# Patient Record
Sex: Female | Born: 1965
Health system: Southern US, Community
[De-identification: ages and names within clinical notes are randomized; demographics above are authoritative.]

## PROBLEM LIST (undated history)

## (undated) DIAGNOSIS — E559 Vitamin D deficiency, unspecified: Secondary | ICD-10-CM

## (undated) DIAGNOSIS — K219 Gastro-esophageal reflux disease without esophagitis: Secondary | ICD-10-CM

## (undated) DIAGNOSIS — E079 Disorder of thyroid, unspecified: Secondary | ICD-10-CM

## (undated) HISTORY — DX: Disorder of thyroid, unspecified: E07.9

## (undated) HISTORY — DX: Gastro-esophageal reflux disease without esophagitis: K21.9

## (undated) HISTORY — DX: Vitamin D deficiency, unspecified: E55.9

---

## 1999-05-06 ENCOUNTER — Other Ambulatory Visit: Admission: RE | Admit: 1999-05-06 | Discharge: 1999-05-06 | Payer: Self-pay | Admitting: *Deleted

## 1999-06-24 ENCOUNTER — Other Ambulatory Visit: Admission: RE | Admit: 1999-06-24 | Discharge: 1999-06-24 | Payer: Self-pay | Admitting: *Deleted

## 1999-06-24 ENCOUNTER — Encounter (INDEPENDENT_AMBULATORY_CARE_PROVIDER_SITE_OTHER): Payer: Self-pay

## 2003-09-05 ENCOUNTER — Other Ambulatory Visit: Admission: RE | Admit: 2003-09-05 | Discharge: 2003-09-05 | Payer: Self-pay | Admitting: Family Medicine

## 2006-06-12 ENCOUNTER — Other Ambulatory Visit: Admission: RE | Admit: 2006-06-12 | Discharge: 2006-06-12 | Payer: Self-pay | Admitting: Family Medicine

## 2007-07-23 ENCOUNTER — Other Ambulatory Visit: Admission: RE | Admit: 2007-07-23 | Discharge: 2007-07-23 | Payer: Self-pay | Admitting: Family Medicine

## 2008-11-30 ENCOUNTER — Other Ambulatory Visit: Admission: RE | Admit: 2008-11-30 | Discharge: 2008-11-30 | Payer: Self-pay | Admitting: Family Medicine

## 2010-08-30 ENCOUNTER — Other Ambulatory Visit
Admission: RE | Admit: 2010-08-30 | Discharge: 2010-08-30 | Payer: Self-pay | Source: Home / Self Care | Admitting: Family Medicine

## 2011-08-27 ENCOUNTER — Other Ambulatory Visit: Payer: Self-pay | Admitting: Family Medicine

## 2011-08-27 ENCOUNTER — Other Ambulatory Visit (HOSPITAL_COMMUNITY)
Admission: RE | Admit: 2011-08-27 | Discharge: 2011-08-27 | Disposition: A | Discharge status: Home / Self Care | Payer: Self-pay | Source: Ambulatory Visit | Attending: Family Medicine | Admitting: Family Medicine

## 2011-08-27 DIAGNOSIS — Z124 Encounter for screening for malignant neoplasm of cervix: Secondary | ICD-10-CM | POA: Insufficient documentation

## 2012-10-11 ENCOUNTER — Other Ambulatory Visit: Payer: Self-pay | Admitting: Family Medicine

## 2012-10-15 ENCOUNTER — Other Ambulatory Visit: Payer: Self-pay | Admitting: Family Medicine

## 2012-10-15 DIAGNOSIS — N6320 Unspecified lump in the left breast, unspecified quadrant: Secondary | ICD-10-CM

## 2012-10-20 ENCOUNTER — Other Ambulatory Visit: Payer: Self-pay | Admitting: Radiology

## 2012-10-27 ENCOUNTER — Other Ambulatory Visit: Payer: Self-pay

## 2014-09-27 ENCOUNTER — Other Ambulatory Visit: Payer: Self-pay | Admitting: Family Medicine

## 2014-09-27 ENCOUNTER — Other Ambulatory Visit (HOSPITAL_COMMUNITY)
Admission: RE | Admit: 2014-09-27 | Discharge: 2014-09-27 | Disposition: A | Discharge status: Home / Self Care | Payer: BLUE CROSS/BLUE SHIELD | Source: Ambulatory Visit | Attending: Family Medicine | Admitting: Family Medicine

## 2014-09-27 DIAGNOSIS — Z124 Encounter for screening for malignant neoplasm of cervix: Secondary | ICD-10-CM | POA: Diagnosis present

## 2014-09-27 DIAGNOSIS — Z1151 Encounter for screening for human papillomavirus (HPV): Secondary | ICD-10-CM | POA: Diagnosis present

## 2014-10-02 LAB — CYTOLOGY - PAP

## 2016-09-16 DIAGNOSIS — Z0000 Encounter for general adult medical examination without abnormal findings: Secondary | ICD-10-CM | POA: Diagnosis not present

## 2016-09-16 DIAGNOSIS — E559 Vitamin D deficiency, unspecified: Secondary | ICD-10-CM | POA: Diagnosis not present

## 2016-09-16 DIAGNOSIS — Z1231 Encounter for screening mammogram for malignant neoplasm of breast: Secondary | ICD-10-CM | POA: Diagnosis not present

## 2016-09-16 DIAGNOSIS — Z79899 Other long term (current) drug therapy: Secondary | ICD-10-CM | POA: Diagnosis not present

## 2016-09-16 DIAGNOSIS — Z13220 Encounter for screening for lipoid disorders: Secondary | ICD-10-CM | POA: Diagnosis not present

## 2016-09-16 DIAGNOSIS — E039 Hypothyroidism, unspecified: Secondary | ICD-10-CM | POA: Diagnosis not present

## 2016-09-16 DIAGNOSIS — Z1159 Encounter for screening for other viral diseases: Secondary | ICD-10-CM | POA: Diagnosis not present

## 2016-12-18 DIAGNOSIS — E559 Vitamin D deficiency, unspecified: Secondary | ICD-10-CM | POA: Diagnosis not present

## 2017-10-08 ENCOUNTER — Other Ambulatory Visit: Payer: Self-pay | Admitting: Family Medicine

## 2017-10-08 ENCOUNTER — Other Ambulatory Visit (HOSPITAL_COMMUNITY)
Admission: RE | Admit: 2017-10-08 | Discharge: 2017-10-08 | Disposition: A | Discharge status: Home / Self Care | Payer: BLUE CROSS/BLUE SHIELD | Source: Ambulatory Visit | Attending: Family Medicine | Admitting: Family Medicine

## 2017-10-08 DIAGNOSIS — Z79899 Other long term (current) drug therapy: Secondary | ICD-10-CM | POA: Diagnosis not present

## 2017-10-08 DIAGNOSIS — Z1231 Encounter for screening mammogram for malignant neoplasm of breast: Secondary | ICD-10-CM | POA: Diagnosis not present

## 2017-10-08 DIAGNOSIS — Z124 Encounter for screening for malignant neoplasm of cervix: Secondary | ICD-10-CM | POA: Diagnosis not present

## 2017-10-08 DIAGNOSIS — Z01411 Encounter for gynecological examination (general) (routine) with abnormal findings: Secondary | ICD-10-CM | POA: Diagnosis not present

## 2017-10-08 DIAGNOSIS — Z803 Family history of malignant neoplasm of breast: Secondary | ICD-10-CM | POA: Diagnosis not present

## 2017-10-08 DIAGNOSIS — E039 Hypothyroidism, unspecified: Secondary | ICD-10-CM | POA: Diagnosis not present

## 2017-10-08 DIAGNOSIS — E559 Vitamin D deficiency, unspecified: Secondary | ICD-10-CM | POA: Diagnosis not present

## 2017-10-08 DIAGNOSIS — Z0000 Encounter for general adult medical examination without abnormal findings: Secondary | ICD-10-CM | POA: Diagnosis not present

## 2017-10-12 LAB — CYTOLOGY - PAP: Diagnosis: NEGATIVE

## 2018-01-21 DIAGNOSIS — M79605 Pain in left leg: Secondary | ICD-10-CM | POA: Diagnosis not present

## 2018-01-21 DIAGNOSIS — E039 Hypothyroidism, unspecified: Secondary | ICD-10-CM | POA: Diagnosis not present

## 2018-01-22 DIAGNOSIS — M79605 Pain in left leg: Secondary | ICD-10-CM | POA: Diagnosis not present

## 2018-01-28 ENCOUNTER — Other Ambulatory Visit: Payer: Self-pay

## 2018-01-28 DIAGNOSIS — R600 Localized edema: Secondary | ICD-10-CM

## 2018-01-28 DIAGNOSIS — M7989 Other specified soft tissue disorders: Secondary | ICD-10-CM

## 2018-03-15 ENCOUNTER — Ambulatory Visit (INDEPENDENT_AMBULATORY_CARE_PROVIDER_SITE_OTHER): Payer: BLUE CROSS/BLUE SHIELD | Admitting: Surgery

## 2018-03-15 ENCOUNTER — Ambulatory Visit (HOSPITAL_COMMUNITY)
Admission: RE | Admit: 2018-03-15 | Discharge: 2018-03-15 | Disposition: A | Discharge status: Home / Self Care | Payer: BLUE CROSS/BLUE SHIELD | Source: Ambulatory Visit | Attending: Surgery | Admitting: Surgery

## 2018-03-15 ENCOUNTER — Other Ambulatory Visit: Payer: Self-pay

## 2018-03-15 ENCOUNTER — Encounter: Payer: Self-pay | Admitting: Surgery

## 2018-03-15 VITALS — BP 135/87 | HR 71 | Resp 18 | Ht 64.0 in | Wt 165.0 lb

## 2018-03-15 DIAGNOSIS — M7989 Other specified soft tissue disorders: Secondary | ICD-10-CM

## 2018-03-15 DIAGNOSIS — R600 Localized edema: Secondary | ICD-10-CM

## 2018-03-15 NOTE — Progress Notes (Signed)
[facility]  Patient name: Kayla Ponce MRN: [id number] DOB: 1966-03-26 Sex: female   REQUESTING PROVIDER:    Dr. Paulino Rily   REASON FOR CONSULT:    Leg pain and swelling  HISTORY OF PRESENT ILLNESS:   Kayla Ponce is a 52 y.o. female, who is referred for evaluation of leg pain and swelling.  She states that her left leg has been bothering her for approximately 6-8 months, and her right leg began bothering her about 1 month ago.  She says her feet hurt on the bottom.  Sometimes when she is in the car driving to work when she gets out an hour and a half later that she has trouble walking because of the pain.  Once she gets walking, the pain seems to get better.  She also has swelling at night.  She comes with an ultrasound that was negative for DVT.  PAST MEDICAL HISTORY    Past Medical History:  Diagnosis Date  . GERD (gastroesophageal reflux disease)   . Thyroid disease   . Vitamin D deficiency      FAMILY HISTORY   History reviewed. No pertinent family history.  SOCIAL HISTORY:   Social History   Socioeconomic History  . Marital status: Married    Spouse name: Not on file  . Number of children: Not on file  . Years of education: Not on file  . Highest education level: Not on file  Occupational History  . Not on file  Social Needs  . Financial resource strain: Not on file  . Food insecurity:    Worry: Not on file    Inability: Not on file  . Transportation needs:    Medical: Not on file    Non-medical: Not on file  Tobacco Use  . Smoking status: Current Every Day Smoker    Packs/day: 0.00    Years: 35.00    Pack years: 0.00    Types: Cigarettes  . Smokeless tobacco: Never Used  Substance and Sexual Activity  . Alcohol use: Not on file  . Drug use: Not on file  . Sexual activity: Not on file  Lifestyle  . Physical activity:    Days per week: Not on file    Minutes per session: Not on  file  . Stress: Not on file  Relationships  . Social connections:    Talks on phone: Not on file    Gets together: Not on file    Attends religious service: Not on file    Active member of club or organization: Not on file    Attends meetings of clubs or organizations: Not on file    Relationship status: Not on file  . Intimate partner violence:    Fear of current or ex partner: Not on file    Emotionally abused: Not on file    Physically abused: Not on file    Forced sexual activity: Not on file  Other Topics Concern  . Not on file  Social History Narrative  . Not on file    ALLERGIES:    No Known Allergies  CURRENT MEDICATIONS:    Current Outpatient Medications  Medication Sig Dispense Refill  . levothyroxine (SYNTHROID, LEVOTHROID) 137 MCG tablet Take 137 mcg by mouth daily before breakfast.     No current facility-administered medications for this visit.     REVIEW OF SYSTEMS:   [X]  denotes positive finding, [ ]  denotes negative finding Cardiac  Comments:  Chest pain or  chest pressure:    Shortness of breath upon exertion:    Short of breath when lying flat:    Irregular heart rhythm:        Vascular    Pain in calf, thigh, or hip brought on by ambulation:    Pain in feet at night that wakes you up from your sleep:     Blood clot in your veins:    Leg swelling:  x       Pulmonary    Oxygen at home:    Productive cough:     Wheezing:         Neurologic    Sudden weakness in arms or legs:     Sudden numbness in arms or legs:     Sudden onset of difficulty speaking or slurred speech:    Temporary loss of vision in one eye:     Problems with dizziness:         Gastrointestinal    Blood in stool:      Vomited blood:         Genitourinary    Burning when urinating:     Blood in urine:        Psychiatric    Major depression:         Hematologic    Bleeding problems:    Problems with blood clotting too easily:        Skin    Rashes or  ulcers:        Constitutional    Fever or chills:     PHYSICAL EXAM:   Vitals:   03/15/18 1000  BP: 135/87  Pulse: 71  Resp: 18  SpO2: 99%  Weight: 165 lb (74.8 kg)  Height: 5\' 4"  (1.626 m)    GENERAL: The patient is a well-nourished female, in no acute distress. The vital signs are documented above. CARDIAC: There is a regular rate and rhythm.  VASCULAR: Palpable bilateral posterior tibial pulse.  1+ pitting edema up to the midcalf bilaterally PULMONARY: Nonlabored respirations MUSCULOSKELETAL: There are no major deformities or cyanosis. NEUROLOGIC: No focal weakness or paresthesias are detected. SKIN: There are no ulcers or rashes noted. PSYCHIATRIC: The patient has a normal affect.  STUDIES:   I have ordered and reviewed her vascular lab studies with the following findings: Venous reflux study: There is no evidence of DVT, superficial venous thrombosis or chronic venous insufficiency in either lower extremity  ASSESSMENT and PLAN   Bilateral leg pain: I discussed with the patient that she has palpable pulses and a normal venous reflux evaluation.  I do not think her symptoms are related to arterial or venous pathology.  She likely has a component of lymphedema contributing to her swelling.  For this I have recommended wearing 20-30 compression stockings to see if this helps alleviate some of her symptoms.  I also told her that since her symptoms appear to be getting better with walking that she may have some orthopedic foot problem and may benefit from seeing an orthopedist.  She is going to try the compression stockings and then speak with her medical doctor about the next steps.   Durene CalWells Brabham, MD [facility] Tel [phone] Pager [phone]

## 2018-06-16 DIAGNOSIS — N63 Unspecified lump in unspecified breast: Secondary | ICD-10-CM | POA: Diagnosis not present

## 2018-06-24 DIAGNOSIS — N6001 Solitary cyst of right breast: Secondary | ICD-10-CM | POA: Diagnosis not present

## 2018-08-16 DIAGNOSIS — M545 Low back pain: Secondary | ICD-10-CM | POA: Diagnosis not present

## 2018-08-16 DIAGNOSIS — R05 Cough: Secondary | ICD-10-CM | POA: Diagnosis not present

## 2018-08-16 DIAGNOSIS — R509 Fever, unspecified: Secondary | ICD-10-CM | POA: Diagnosis not present

## 2018-09-24 DIAGNOSIS — J111 Influenza due to unidentified influenza virus with other respiratory manifestations: Secondary | ICD-10-CM | POA: Diagnosis not present

## 2018-10-04 ENCOUNTER — Encounter (HOSPITAL_COMMUNITY): Payer: Self-pay | Admitting: *Deleted

## 2018-10-04 ENCOUNTER — Observation Stay (HOSPITAL_COMMUNITY)
Admission: EM | Admit: 2018-10-04 | Discharge: 2018-10-05 | Disposition: A | Discharge status: Home / Self Care | Payer: BLUE CROSS/BLUE SHIELD | Attending: Internal Medicine | Admitting: Internal Medicine

## 2018-10-04 ENCOUNTER — Emergency Department (HOSPITAL_COMMUNITY): Payer: BLUE CROSS/BLUE SHIELD

## 2018-10-04 DIAGNOSIS — E559 Vitamin D deficiency, unspecified: Secondary | ICD-10-CM | POA: Insufficient documentation

## 2018-10-04 DIAGNOSIS — R9431 Abnormal electrocardiogram [ECG] [EKG]: Secondary | ICD-10-CM | POA: Diagnosis not present

## 2018-10-04 DIAGNOSIS — F17210 Nicotine dependence, cigarettes, uncomplicated: Secondary | ICD-10-CM | POA: Insufficient documentation

## 2018-10-04 DIAGNOSIS — J45901 Unspecified asthma with (acute) exacerbation: Secondary | ICD-10-CM | POA: Diagnosis not present

## 2018-10-04 DIAGNOSIS — E876 Hypokalemia: Secondary | ICD-10-CM | POA: Insufficient documentation

## 2018-10-04 DIAGNOSIS — J9601 Acute respiratory failure with hypoxia: Principal | ICD-10-CM | POA: Insufficient documentation

## 2018-10-04 DIAGNOSIS — J189 Pneumonia, unspecified organism: Secondary | ICD-10-CM | POA: Insufficient documentation

## 2018-10-04 DIAGNOSIS — J209 Acute bronchitis, unspecified: Secondary | ICD-10-CM | POA: Insufficient documentation

## 2018-10-04 DIAGNOSIS — Z79890 Hormone replacement therapy (postmenopausal): Secondary | ICD-10-CM | POA: Diagnosis not present

## 2018-10-04 DIAGNOSIS — N6310 Unspecified lump in the right breast, unspecified quadrant: Secondary | ICD-10-CM | POA: Insufficient documentation

## 2018-10-04 DIAGNOSIS — E039 Hypothyroidism, unspecified: Secondary | ICD-10-CM | POA: Diagnosis not present

## 2018-10-04 DIAGNOSIS — M79604 Pain in right leg: Secondary | ICD-10-CM | POA: Insufficient documentation

## 2018-10-04 DIAGNOSIS — R0600 Dyspnea, unspecified: Secondary | ICD-10-CM | POA: Diagnosis not present

## 2018-10-04 DIAGNOSIS — K219 Gastro-esophageal reflux disease without esophagitis: Secondary | ICD-10-CM | POA: Diagnosis not present

## 2018-10-04 DIAGNOSIS — N6001 Solitary cyst of right breast: Secondary | ICD-10-CM

## 2018-10-04 DIAGNOSIS — R0602 Shortness of breath: Secondary | ICD-10-CM | POA: Diagnosis not present

## 2018-10-04 LAB — I-STAT BETA HCG BLOOD, ED (MC, WL, AP ONLY): I-stat hCG, quantitative: <5 m[IU]/mL (ref <5)

## 2018-10-04 LAB — CBC
HCT: 39.4 % (ref 36.0–46.0)
HEMOGLOBIN: 13 g/dL (ref 12.0–15.0)
MCH: 30.6 pg (ref 26.0–34.0)
MCHC: 33 g/dL (ref 30.0–36.0)
MCV: 92.7 fL (ref 80.0–100.0)
Platelets: 288 10*3/uL (ref 150–400)
RBC: 4.25 MIL/uL (ref 3.87–5.11)
RDW: 11.9 % (ref 11.5–15.5)
WBC: 7 10*3/uL (ref 4.0–10.5)
nRBC: 0 % (ref 0.0–0.2)

## 2018-10-04 LAB — BASIC METABOLIC PANEL
Anion gap: 10 (ref 5–15)
BUN: 7 mg/dL (ref 6–20)
CALCIUM: 9 mg/dL (ref 8.9–10.3)
CO2: 26 mmol/L (ref 22–32)
Chloride: 106 mmol/L (ref 98–111)
Creatinine, Ser: 1.01 mg/dL — ABNORMAL HIGH (ref 0.44–1.00)
GFR calc Af Amer: >60 mL/min (ref >60)
GLUCOSE: 115 mg/dL — AB (ref 70–99)
POTASSIUM: 2.7 mmol/L — AB (ref 3.5–5.1)
Sodium: 142 mmol/L (ref 135–145)

## 2018-10-04 LAB — I-STAT TROPONIN, ED: TROPONIN I, POC: 0.02 ng/mL (ref 0.00–0.08)

## 2018-10-04 LAB — MAGNESIUM: MAGNESIUM: 2.5 mg/dL — AB (ref 1.7–2.4)

## 2018-10-04 MED ORDER — POTASSIUM CHLORIDE CRYS ER 20 MEQ PO TBCR
40.0000 meq | EXTENDED_RELEASE_TABLET | Freq: Once | ORAL | Status: AC
  Administered 2018-10-04: 40 meq via ORAL
  Filled 2018-10-04: qty 2

## 2018-10-04 MED ORDER — ONDANSETRON HCL 4 MG PO TABS: 4.0000 mg | ORAL_TABLET | Freq: Four times a day (QID) | ORAL | Status: DC | PRN

## 2018-10-04 MED ORDER — ACETAMINOPHEN 650 MG RE SUPP: 650.0000 mg | Freq: Four times a day (QID) | RECTAL | Status: DC | PRN

## 2018-10-04 MED ORDER — IOPAMIDOL (ISOVUE-370) INJECTION 76%
INTRAVENOUS | Status: AC
  Administered 2018-10-04: 20:00:00
  Filled 2018-10-04: qty 100

## 2018-10-04 MED ORDER — ONDANSETRON HCL 4 MG/2ML IJ SOLN: 4.0000 mg | Freq: Four times a day (QID) | INTRAMUSCULAR | Status: DC | PRN

## 2018-10-04 MED ORDER — LEVOTHYROXINE SODIUM 25 MCG PO TABS
137.0000 ug | ORAL_TABLET | Freq: Every day | ORAL | Status: DC
  Administered 2018-10-05: 137 ug via ORAL
  Filled 2018-10-04: qty 1

## 2018-10-04 MED ORDER — SODIUM CHLORIDE 0.9 % IV SOLN
500.0000 mg | Freq: Once | INTRAVENOUS | Status: AC
  Administered 2018-10-04: 500 mg via INTRAVENOUS
  Filled 2018-10-04: qty 500

## 2018-10-04 MED ORDER — IPRATROPIUM BROMIDE 0.02 % IN SOLN
0.5000 mg | Freq: Once | RESPIRATORY_TRACT | Status: AC
  Administered 2018-10-04: 0.5 mg via RESPIRATORY_TRACT
  Filled 2018-10-04: qty 2.5

## 2018-10-04 MED ORDER — ALBUTEROL SULFATE (2.5 MG/3ML) 0.083% IN NEBU
5.0000 mg | INHALATION_SOLUTION | Freq: Once | RESPIRATORY_TRACT | Status: AC
  Administered 2018-10-04: 5 mg via RESPIRATORY_TRACT
  Filled 2018-10-04: qty 6

## 2018-10-04 MED ORDER — ALBUTEROL SULFATE (2.5 MG/3ML) 0.083% IN NEBU
2.5000 mg | INHALATION_SOLUTION | RESPIRATORY_TRACT | Status: DC
  Administered 2018-10-05 (×4): 2.5 mg via RESPIRATORY_TRACT
  Filled 2018-10-04 (×4): qty 3

## 2018-10-04 MED ORDER — POTASSIUM CHLORIDE 10 MEQ/100ML IV SOLN
10.0000 meq | INTRAVENOUS | Status: AC
  Administered 2018-10-04: 10 meq via INTRAVENOUS
  Filled 2018-10-04: qty 100

## 2018-10-04 MED ORDER — ENOXAPARIN SODIUM 40 MG/0.4ML ~~LOC~~ SOLN
40.0000 mg | SUBCUTANEOUS | Status: DC
  Administered 2018-10-05: 40 mg via SUBCUTANEOUS
  Filled 2018-10-04 (×2): qty 0.4

## 2018-10-04 MED ORDER — ALBUTEROL SULFATE (2.5 MG/3ML) 0.083% IN NEBU: 2.5000 mg | INHALATION_SOLUTION | RESPIRATORY_TRACT | Status: DC | PRN

## 2018-10-04 MED ORDER — SODIUM CHLORIDE 0.9 % IV SOLN: 1.0000 g | INTRAVENOUS | Status: DC

## 2018-10-04 MED ORDER — SODIUM CHLORIDE 0.9% FLUSH
3.0000 mL | Freq: Once | INTRAVENOUS | Status: AC
  Administered 2018-10-04: 3 mL via INTRAVENOUS

## 2018-10-04 MED ORDER — METHYLPREDNISOLONE SODIUM SUCC 125 MG IJ SOLR
125.0000 mg | Freq: Once | INTRAMUSCULAR | Status: AC
  Administered 2018-10-04: 125 mg via INTRAVENOUS
  Filled 2018-10-04: qty 2

## 2018-10-04 MED ORDER — SODIUM CHLORIDE 0.9 % IV SOLN
500.0000 mg | INTRAVENOUS | Status: DC
  Filled 2018-10-04 (×2): qty 500

## 2018-10-04 MED ORDER — METHYLPREDNISOLONE SODIUM SUCC 40 MG IJ SOLR
40.0000 mg | Freq: Two times a day (BID) | INTRAMUSCULAR | Status: DC
  Administered 2018-10-05: 40 mg via INTRAVENOUS
  Filled 2018-10-04: qty 1

## 2018-10-04 MED ORDER — DEXTROSE 5 % IV SOLN
250.0000 mg | Freq: Once | INTRAVENOUS | Status: DC
  Filled 2018-10-04: qty 250

## 2018-10-04 MED ORDER — ONDANSETRON 4 MG PO TBDP: 4.0000 mg | ORAL_TABLET | Freq: Once | ORAL | Status: DC

## 2018-10-04 MED ORDER — SODIUM CHLORIDE 0.9 % IV SOLN
1.0000 g | Freq: Once | INTRAVENOUS | Status: AC
  Administered 2018-10-04: 1 g via INTRAVENOUS
  Filled 2018-10-04: qty 10

## 2018-10-04 MED ORDER — ACETAMINOPHEN 325 MG PO TABS: 650.0000 mg | ORAL_TABLET | Freq: Four times a day (QID) | ORAL | Status: DC | PRN

## 2018-10-04 NOTE — ED Provider Notes (Signed)
[facility] EMERGENCY DEPARTMENT Provider Note   CSN: [id number] Arrival date & time: 10/04/18  1525     History   Chief Complaint Chief Complaint  Patient presents with  . Shortness of Breath    HPI Kayla Ponce is a 53 y.o. female.  HPI  Patient is a 53 year old female with a history of hypothyroidism, vitamin D deficiency, GERD presenting for shortness of breath.  Patient reports that her symptoms began worsening over the past 3 to 4 days.  She reports that she was treated for influenza on Friday, January 30 because her husband had a positive test for influenza A and she had the same symptoms.  She reports that she had fevers at that time, myalgias, headache, sore throat, and congestion rhinorrhea.  This seem to be resolving, however patient reports that she is short of breath with any exertion.  She reports that she is wheezing.  She reports that she was nauseous over the course of this illness but has not vomited.  She reports 2-3 episodes of diarrhea but has not had any in the past 3 to 4 days.  Patient ports she smokes approximately 1 pack of cigarettes a day.  She denies any history of chronic lung disease.  Patient denies any recent mobilization, hospitalization, hormone use, cancer treatment, history DVT/PE, recent surgery, lower extremity edema or calf tenderness.  No hemoptysis.  Past Medical History:  Diagnosis Date  . GERD (gastroesophageal reflux disease)   . Thyroid disease   . Vitamin D deficiency     There are no active problems to display for this patient.   History reviewed. No pertinent surgical history.   OB History   No obstetric history on file.      Home Medications    Prior to Admission medications   Medication Sig Start Date End Date Taking? Authorizing Provider  levothyroxine (SYNTHROID, LEVOTHROID) 137 MCG tablet Take 137 mcg by mouth daily before breakfast.    [provider]    Family History No family history  on file.  Social History Social History   Tobacco Use  . Smoking status: Current Every Day Smoker    Packs/day: 0.75    Years: 35.00    Pack years: 26.25    Types: Cigarettes  . Smokeless tobacco: Never Used  Substance Use Topics  . Alcohol use: Not Currently  . Drug use: Not Currently     Allergies   Patient has no known allergies.   Review of Systems Review of Systems  Constitutional: Negative for chills and fever.  HENT: Negative for congestion and sore throat.   Eyes: Negative for visual disturbance.  Respiratory: Positive for cough, shortness of breath and wheezing. Negative for chest tightness.   Cardiovascular: Negative for chest pain, palpitations and leg swelling.  Gastrointestinal: Negative for abdominal pain, nausea and vomiting.  Genitourinary: Negative for dysuria and flank pain.  Musculoskeletal: Positive for myalgias. Negative for back pain.  Skin: Negative for rash.  Neurological: Negative for dizziness, syncope, light-headedness and headaches.     Physical Exam Updated Vital Signs BP (!) 138/94   Pulse 65   Temp 98.4 F (36.9 C) (Oral)   Resp (!) 21   Ht 5\' 4"  (1.626 m)   Wt 71.7 kg   SpO2 95%   BMI 27.12 kg/m   Physical Exam Vitals signs and nursing note reviewed.  Constitutional:      General: She is not in acute distress.    Appearance: She is  well-developed.  HENT:     Head: Normocephalic and atraumatic.  Eyes:     Conjunctiva/sclera: Conjunctivae normal.     Pupils: Pupils are equal, round, and reactive to light.  Neck:     Musculoskeletal: Normal range of motion and neck supple.  Cardiovascular:     Rate and Rhythm: Normal rate and regular rhythm.     Heart sounds: S1 normal and S2 normal. No murmur.     Comments: No lower extremity edema.  No calf tenderness. Pulmonary:     Effort: Pulmonary effort is normal.     Breath sounds: Examination of the right-middle field reveals wheezing. Examination of the left-middle field  reveals wheezing. Examination of the right-lower field reveals wheezing. Examination of the left-lower field reveals wheezing. Wheezing present. No rales.  Abdominal:     General: There is no distension.     Palpations: Abdomen is soft.     Tenderness: There is no abdominal tenderness. There is no guarding.  Musculoskeletal: Normal range of motion.        General: No deformity.  Lymphadenopathy:     Cervical: No cervical adenopathy.  Skin:    General: Skin is warm and dry.     Findings: No erythema or rash.  Neurological:     Mental Status: She is alert.     Comments: Cranial nerves grossly intact. Patient moves extremities symmetrically and with good coordination.  Psychiatric:        Behavior: Behavior normal.        Thought Content: Thought content normal.        Judgment: Judgment normal.      ED Treatments / Results  Labs (all labs ordered are listed, but only abnormal results are displayed) Labs Reviewed  BASIC METABOLIC PANEL - Abnormal; Notable for the following components:      Result Value   Potassium 2.7 (*)    Glucose, Bld 115 (*)    Creatinine, Ser 1.01 (*)    All other components within normal limits  MAGNESIUM - Abnormal; Notable for the following components:   Magnesium 2.5 (*)    All other components within normal limits  CBC  I-STAT TROPONIN, ED  I-STAT BETA HCG BLOOD, ED ([facility], [facility], [facility] ONLY)    EKG EKG Interpretation  Date/Time:  Monday October 04 2018 15:32:59 EST Ventricular Rate:  82 PR Interval:  162 QRS Duration: 90 QT Interval:  382 QTC Calculation: 446 R Axis:   89 Text Interpretation:  Normal sinus rhythm ST & T wave abnormality, consider inferolateral ischemia Abnormal ECG Confirmed by Virgina NorfolkAdam, Curatolo [phone]) on 10/04/2018 6:44:13 PM   Radiology Dg Chest 2 View  Result Date: 10/04/2018 CLINICAL DATA:  Shortness of breath. Completed antibiotics 2 days ago. EXAM: CHEST - 2 VIEW COMPARISON:  None. FINDINGS: Lungs are adequately inflated  with possible mild linear density over the right middle lobe versus vascular crowding. No effusion. Cardiomediastinal silhouette and remainder the exam is within normal. IMPRESSION: Subtle nonspecific linear density over the right middle lobe on the lateral film which may be due vascular crowding, atelectasis or infection. Electronically Signed   By: Elberta Fortisaniel  Boyle M.D.   On: 10/04/2018 16:17   Ct Angio Chest Pe W/cm &/or Wo Cm  Result Date: 10/04/2018 CLINICAL DATA:  Shortness of Breath, history of upper respiratory infection EXAM: CT ANGIOGRAPHY CHEST WITH CONTRAST TECHNIQUE: Multidetector CT imaging of the chest was performed using the standard protocol during bolus administration of intravenous contrast. Multiplanar CT image reconstructions  and MIPs were obtained to evaluate the vascular anatomy. CONTRAST:  75 mL Isovue 370. COMPARISON:  Plain film from earlier in the same day. FINDINGS: Cardiovascular: Thoracic aorta shows no aneurysmal dilatation dissection. No cardiac enlargement is seen. The pulmonary artery shows a normal branching pattern. No filling defect to suggest pulmonary embolism is noted. Mild coronary calcifications are noted. Mediastinum/Nodes: The esophagus is within normal limits. No hilar or mediastinal adenopathy is noted. The thoracic inlet is within normal limits. Lungs/Pleura: Lungs are well aerated bilaterally. No sizable effusion is noted. Minimal patchy infiltrative changes noted in the right upper lobe consistent with the history of recent upper respiratory infection. No pneumothorax is seen. Minimal scarring in the right lung base is noted laterally. Upper Abdomen: Visualized upper abdomen is within normal limits. Musculoskeletal: No acute bony abnormality is noted. No compression deformity is seen. In the right breast there is a hypodense mass lesion which measures 4.4 cm in greatest dimension. This may represent a large breast cyst. Nonemergent workup is recommended. Review of  the MIP images confirms the above findings. IMPRESSION: No evidence of pulmonary emboli. Patchy infiltrative changes in the right upper lobe consistent with the history of resolving infection. Hypodense lesion within the right breast measuring 4.4 cm which may represent a large breast cyst. Nonemergent workup is recommended. Electronically Signed   By: Alcide Clever M.D.   On: 10/04/2018 20:03    Procedures Procedures (including critical care time)  Medications Ordered in ED Medications  sodium chloride flush (NS) 0.9 % injection 3 mL (has no administration in time range)  cefTRIAXone (ROCEPHIN) 1 g in sodium chloride 0.9 % 100 mL IVPB (has no administration in time range)  azithromycin (ZITHROMAX) 250 mg in dextrose 5 % 125 mL IVPB (has no administration in time range)  potassium chloride 10 mEq in 100 mL IVPB (has no administration in time range)  methylPREDNISolone sodium succinate (SOLU-MEDROL) 125 mg/2 mL injection 125 mg (125 mg Intravenous Given 10/04/18 1908)  ipratropium (ATROVENT) nebulizer solution 0.5 mg (0.5 mg Nebulization Given 10/04/18 1905)  albuterol (PROVENTIL) (2.5 MG/3ML) 0.083% nebulizer solution 5 mg (5 mg Nebulization Given 10/04/18 1905)  potassium chloride SA (K-DUR,KLOR-CON) CR tablet 40 mEq (40 mEq Oral Given 10/04/18 1905)  iopamidol (ISOVUE-370) 76 % injection (  Contrast Given 10/04/18 1930)     Initial Impression / Assessment and Plan / ED Course  I have reviewed the triage vital signs and the nursing notes.  Pertinent labs & imaging results that were available during my care of the patient were reviewed by me and considered in my medical decision making (see chart for details).     Patient nontoxic-appearing, afebrile, slightly tachypneic.  Patient with diffuse wheezes on exam.  No history of COPD or asthma.  No risk factors for pulmonary embolism, patient does have patchy infiltrate on the chest x-ray.  Question pneumonia versus other cause of infiltrate.  No  leukocytosis.  Do not suspect sepsis.  Patient has potassium of 2.7.  Nonspecific ST-T changes on EKG. Unclear etiology.  Patient is not on any diuretic therapy.  She has not had any persistent vomiting.  Repleted orally and IV.  CTPA demonstrates right middle lobe infiltrate suggestive of resolving infection.  No pulmonary embolism.  Patient also has a right breast cyst.  Patient reports she is currently getting work-up for this will have a mammogram in 1 week.  Patient given 1 breathing treatment, Solu-Medrol, and antibiotics for infiltrate.  Patient still has wheezing after initial  breathing treatment, however it is improved and lungs are less tight.  Will admit for respiratory decompensation the recent upper respiratory infection and potassium repletion.  Patient admitted to [facility] hospitalist per Dr. Toniann FailKakrakandy.  Appreciate his involvement in the care of this patient.  Final Clinical Impressions(s) / ED Diagnoses   Final diagnoses:  Reactive airway disease with acute exacerbation, unspecified asthma severity, unspecified whether persistent  Hypokalemia  Cyst of right breast    ED Discharge Orders    None       Delia ChimesMurray, Yakov Bergen B, PA-C 10/04/18 2105    Virgina NorfolkCuratolo, Adam, DO 10/04/18 2311

## 2018-10-04 NOTE — H&P (Signed)
History and Physical    Mindel Verhelst QMV:[id number] DOB: [birth year] DOA: 10/04/2018  PCP: Mila Palmer, MD  Patient coming from: Home.  Chief Complaint: Shortness of breath.  HPI: Kayla Ponce is a 53 y.o. female with history of tobacco abuse and hypothyroidism presents to the ER with complaint of persistent shortness of breath which has been ongoing for last 10 days.  About 10 days ago patient was empirically placed on Tamiflu by patient's primary care physician for wheezing and coughing since patient has been was diagnosed with influenza.  Patient completed the course of Tamiflu despite which patient was still having shortness of breath and cough.  Denies any chest pain.  Due to persistent wheezing and shortness of breath patient came to the ER.  ED Course: On exam patient had bilateral wheezing.  CT angiogram of the chest shows right upper lobe infiltrates concerning for resolving infection.  Patient was placed on IV steroids nebulizer treatment for bronchitis and empiric antibiotics for pneumonia.  Patient's potassium was 2.7 for which patient is getting replacement.  EKG shows nonspecific ST-T changes.  Review of Systems: As per HPI, rest all negative.   Past Medical History:  Diagnosis Date  . GERD (gastroesophageal reflux disease)   . Thyroid disease   . Vitamin D deficiency     History reviewed. No pertinent surgical history.   reports that she has been smoking cigarettes. She has a 26.25 pack-year smoking history. She has never used smokeless tobacco. She reports previous alcohol use. She reports previous drug use.  No Known Allergies  Family history - Mother has Diabetes Mellitus type 2  Prior to Admission medications   Medication Sig Start Date End Date Taking? Authorizing Provider  levothyroxine (SYNTHROID, LEVOTHROID) 137 MCG tablet Take 137 mcg by mouth daily before breakfast.   Yes [provider]    Physical Exam: Vitals:   10/04/18 2015 10/04/18  2030 10/04/18 2045 10/04/18 2130  BP:      Pulse: 82 78 73 87  Resp: 20 16 15 18   Temp:      TempSrc:      SpO2: 92% 93% 100% 95%  Weight:      Height:          Constitutional: Moderately built and nourished. Vitals:   10/04/18 2015 10/04/18 2030 10/04/18 2045 10/04/18 2130  BP:      Pulse: 82 78 73 87  Resp: 20 16 15 18   Temp:      TempSrc:      SpO2: 92% 93% 100% 95%  Weight:      Height:       Eyes: Anicteric no pallor. ENMT: No discharge from the ears eyes nose and mouth. Neck: No mass felt.  No neck rigidity. Respiratory: Bilateral expiratory wheeze and no crepitations. Cardiovascular: S1-S2 heard. Abdomen: Soft nontender bowel sounds present. Musculoskeletal: No edema.  No joint effusion. Skin: No rash. Neurologic: Alert awake oriented to time place and person.  Moves all extremities. Psychiatric: Appears normal.  Normal affect.   Labs on Admission: I have personally reviewed following labs and imaging studies  CBC: Recent Labs  Lab 10/04/18 1545  WBC 7.0  HGB 13.0  HCT 39.4  MCV 92.7  PLT 288   Basic Metabolic Panel: Recent Labs  Lab 10/04/18 1545 10/04/18 1842  NA 142  --   K 2.7*  --   CL 106  --   CO2 26  --   GLUCOSE 115*  --   BUN  7  --   CREATININE 1.01*  --   CALCIUM 9.0  --   MG  --  2.5*   GFR: Estimated Creatinine Clearance: 61.8 mL/min (A) (by C-G formula based on SCr of 1.01 mg/dL (H)). Liver Function Tests: No results for input(s): AST, ALT, ALKPHOS, BILITOT, PROT, ALBUMIN in the last 168 hours. No results for input(s): LIPASE, AMYLASE in the last 168 hours. No results for input(s): AMMONIA in the last 168 hours. Coagulation Profile: No results for input(s): INR, PROTIME in the last 168 hours. Cardiac Enzymes: No results for input(s): CKTOTAL, CKMB, CKMBINDEX, TROPONINI in the last 168 hours. BNP (last 3 results) No results for input(s): PROBNP in the last 8760 hours. HbA1C: No results for input(s): HGBA1C in the last 72  hours. CBG: No results for input(s): GLUCAP in the last 168 hours. Lipid Profile: No results for input(s): CHOL, HDL, LDLCALC, TRIG, CHOLHDL, LDLDIRECT in the last 72 hours. Thyroid Function Tests: No results for input(s): TSH, T4TOTAL, FREET4, T3FREE, THYROIDAB in the last 72 hours. Anemia Panel: No results for input(s): VITAMINB12, FOLATE, FERRITIN, TIBC, IRON, RETICCTPCT in the last 72 hours. Urine analysis: No results found for: COLORURINE, APPEARANCEUR, LABSPEC, PHURINE, GLUCOSEU, HGBUR, BILIRUBINUR, KETONESUR, PROTEINUR, UROBILINOGEN, NITRITE, LEUKOCYTESUR Sepsis Labs: @LABRCNTIP (procalcitonin:4,lacticidven:4) )No results found for this or any previous visit (from the past 240 hour(s)).   Radiological Exams on Admission: Dg Chest 2 View  Result Date: 10/04/2018 CLINICAL DATA:  Shortness of breath. Completed antibiotics 2 days ago. EXAM: CHEST - 2 VIEW COMPARISON:  None. FINDINGS: Lungs are adequately inflated with possible mild linear density over the right middle lobe versus vascular crowding. No effusion. Cardiomediastinal silhouette and remainder the exam is within normal. IMPRESSION: Subtle nonspecific linear density over the right middle lobe on the lateral film which may be due vascular crowding, atelectasis or infection. Electronically Signed   By: Elberta Fortisaniel  Boyle M.D.   On: 10/04/2018 16:17   Ct Angio Chest Pe W/cm &/or Wo Cm  Result Date: 10/04/2018 CLINICAL DATA:  Shortness of Breath, history of upper respiratory infection EXAM: CT ANGIOGRAPHY CHEST WITH CONTRAST TECHNIQUE: Multidetector CT imaging of the chest was performed using the standard protocol during bolus administration of intravenous contrast. Multiplanar CT image reconstructions and MIPs were obtained to evaluate the vascular anatomy. CONTRAST:  75 mL Isovue 370. COMPARISON:  Plain film from earlier in the same day. FINDINGS: Cardiovascular: Thoracic aorta shows no aneurysmal dilatation dissection. No cardiac  enlargement is seen. The pulmonary artery shows a normal branching pattern. No filling defect to suggest pulmonary embolism is noted. Mild coronary calcifications are noted. Mediastinum/Nodes: The esophagus is within normal limits. No hilar or mediastinal adenopathy is noted. The thoracic inlet is within normal limits. Lungs/Pleura: Lungs are well aerated bilaterally. No sizable effusion is noted. Minimal patchy infiltrative changes noted in the right upper lobe consistent with the history of recent upper respiratory infection. No pneumothorax is seen. Minimal scarring in the right lung base is noted laterally. Upper Abdomen: Visualized upper abdomen is within normal limits. Musculoskeletal: No acute bony abnormality is noted. No compression deformity is seen. In the right breast there is a hypodense mass lesion which measures 4.4 cm in greatest dimension. This may represent a large breast cyst. Nonemergent workup is recommended. Review of the MIP images confirms the above findings. IMPRESSION: No evidence of pulmonary emboli. Patchy infiltrative changes in the right upper lobe consistent with the history of resolving infection. Hypodense lesion within the right breast measuring 4.4 cm which  may represent a large breast cyst. Nonemergent workup is recommended. Electronically Signed   By: Alcide CleverMark  Lukens M.D.   On: 10/04/2018 20:03    EKG: Independently reviewed.  Normal sinus rhythm with nonspecific changes.  Assessment/Plan Principal Problem:   Acute respiratory failure with hypoxia (HCC) Active Problems:   CAP (community acquired pneumonia)   Hypokalemia   Acute bronchitis   Hypothyroidism    1. Acute respiratory failure with hypoxia secondary to bronchitis and pneumonia for which patient is placed on empiric antibiotics for community-acquired pneumonia check respiratory viral panel urine for Legionella strep antigen sputum cultures and continue with IV steroids nebulizer treatment. 2. Hypokalemia  likely from poor oral intake as patient states she has not been eating well last few days.  Replace recheck.  Check magnesium. 3. Abnormal density in the right breast seen in the CAT scan will need further work-up as outpatient. 4. Hypothyroidism on Synthroid. 5. Tobacco abuse advised patient to quit smoking.   DVT prophylaxis: Lovenox. Code Status: Full code. Family Communication: Discussed with patient. Disposition Plan: Home. Consults called: None. Admission status: Observation.   Eduard ClosArshad N Trae Bovenzi MD [facility] Pager [phone].  If 7PM-7AM, please contact night-coverage [email or web address] Password [email or web address]  10/04/2018, 9:49 PM

## 2018-10-04 NOTE — ED Triage Notes (Signed)
Pt in c/o SOB onset since diagnosis with upper respiratory infection with completed antibiotics x 2 days ago, pt denies CP, denies current n/v/d, A&O x4

## 2018-10-04 NOTE — ED Notes (Signed)
Critical potassium 2.6

## 2018-10-04 NOTE — ED Notes (Signed)
2.7 Potassium reported to Madilyn Hook, MD

## 2018-10-05 ENCOUNTER — Encounter (HOSPITAL_COMMUNITY): Payer: Self-pay

## 2018-10-05 ENCOUNTER — Other Ambulatory Visit: Payer: Self-pay

## 2018-10-05 DIAGNOSIS — J9601 Acute respiratory failure with hypoxia: Secondary | ICD-10-CM | POA: Diagnosis not present

## 2018-10-05 LAB — RESPIRATORY PANEL BY PCR

## 2018-10-05 LAB — TROPONIN I: Troponin I: <0.03 ng/mL (ref <0.03)

## 2018-10-05 LAB — BASIC METABOLIC PANEL
Anion gap: 12 (ref 5–15)
BUN: 7 mg/dL (ref 6–20)
CO2: 19 mmol/L — ABNORMAL LOW (ref 22–32)
CREATININE: 1.06 mg/dL — AB (ref 0.44–1.00)
Calcium: 8.7 mg/dL — ABNORMAL LOW (ref 8.9–10.3)
Chloride: 111 mmol/L (ref 98–111)
GFR calc Af Amer: >60 mL/min (ref >60)
GFR calc non Af Amer: 59 mL/min — ABNORMAL LOW (ref >60)
Glucose, Bld: 198 mg/dL — ABNORMAL HIGH (ref 70–99)
Potassium: 3.6 mmol/L (ref 3.5–5.1)
Sodium: 142 mmol/L (ref 135–145)

## 2018-10-05 LAB — CBC
HCT: 33.6 % — ABNORMAL LOW (ref 36.0–46.0)
Hemoglobin: 11.4 g/dL — ABNORMAL LOW (ref 12.0–15.0)
MCH: 31.5 pg (ref 26.0–34.0)
MCHC: 33.9 g/dL (ref 30.0–36.0)
MCV: 92.8 fL (ref 80.0–100.0)
Platelets: 268 10*3/uL (ref 150–400)
RBC: 3.62 MIL/uL — ABNORMAL LOW (ref 3.87–5.11)
RDW: 12.3 % (ref 11.5–15.5)
WBC: 8.8 10*3/uL (ref 4.0–10.5)
nRBC: 0 % (ref 0.0–0.2)

## 2018-10-05 LAB — EXPECTORATED SPUTUM ASSESSMENT W GRAM STAIN, RFLX TO RESP C

## 2018-10-05 LAB — VITAMIN B12: Vitamin B-12: 228 pg/mL (ref 180–914)

## 2018-10-05 LAB — HIV ANTIBODY (ROUTINE TESTING W REFLEX): HIV Screen 4th Generation wRfx: NONREACTIVE

## 2018-10-05 LAB — STREP PNEUMONIAE URINARY ANTIGEN: Strep Pneumo Urinary Antigen: NEGATIVE

## 2018-10-05 MED ORDER — CEFDINIR 300 MG PO CAPS: 300.0000 mg | ORAL_CAPSULE | Freq: Two times a day (BID) | ORAL | 0 refills | Status: AC

## 2018-10-05 MED ORDER — PREDNISONE 20 MG PO TABS: 20.0000 mg | ORAL_TABLET | Freq: Every day | ORAL | 0 refills | Status: DC

## 2018-10-05 MED ORDER — NICOTINE 14 MG/24HR TD PT24: 14.0000 mg | MEDICATED_PATCH | TRANSDERMAL | 0 refills | Status: AC

## 2018-10-05 MED ORDER — PREDNISONE 20 MG PO TABS
40.0000 mg | ORAL_TABLET | Freq: Every day | ORAL | Status: DC
  Administered 2018-10-05: 40 mg via ORAL
  Filled 2018-10-05: qty 2

## 2018-10-05 MED ORDER — POTASSIUM CHLORIDE 10 MEQ/100ML IV SOLN
10.0000 meq | Freq: Once | INTRAVENOUS | Status: AC
  Administered 2018-10-05: 10 meq via INTRAVENOUS
  Filled 2018-10-05: qty 100

## 2018-10-05 NOTE — Discharge Summary (Signed)
Physician Discharge Summary  Kayla Ponce [id number] DOB: Jul 05, 1966 DOA: 10/04/2018  PCP: Mila PalmerWolters, Sharon, MD  Admit date: 10/04/2018 Discharge date: 10/05/2018  Time spent: 45 minutes  Recommendations for Outpatient Follow-up:  1. PCP in 1 week, please follow-up B12 level, repeat vitamin D 2. - follow-up chest x-ray in 4 to 6 weeks 3. Needs a mammogram, known right breast cystic lesion   Discharge Diagnoses:  Principal Problem:   Acute respiratory failure with hypoxia (HCC) Active Problems:   CAP (community acquired pneumonia)   Hypokalemia   Acute bronchitis   Hypothyroidism   Discharge Condition: stable  Diet recommendation: heart healthy  Filed Weights   10/04/18 1541  Weight: 71.7 kg    History of present illness:  53 year old female with history of tobacco abuse and hypothyroidism presented to the ED with shortness of breath for 7 to 10 days recently completed a course of Tamiflu for influenza despite this continues to have shortness of breath cough and wheezing hence came to the ER underwent a CT which was notable for right upper lobe resolving infiltrates, potassium was 2.7  Hospital Course:   Acute hypoxic respiratory failure -Due to likely viral pneumonia, reactive airway disease from chronic smoking -Recently completed Tamiflu course for influenza cannot rule out secondary bacterial infection -Started on community-acquired pneumonia coverage and steroids transitioned to prednisone taper and oral cefdinir to complete a 5-day course, considerably improved today -Follow up x-ray in 4 to 6 weeks  Hypokalemia -Due to decreased oral intake and recent diarrhea from Tamiflu, replaced, corrected  Abnormal density in the right breast -Possible cystic lesion on CT, per patient this is being followed -She is scheduled for a mammogram on Monday  Tobacco abuse -Counseled  Hypothyroidism -Continue Synthroid  Some intermittent chronic right leg pain -Dopplers  ruled out DVT in the past -Check vitamin B12, repeat vitamin D level  Discharge Exam: Vitals:   10/05/18 0526 10/05/18 0845  BP:  121/61  Pulse:  80  Resp:  16  Temp:  98.1 F (36.7 C)  SpO2: 93% 98%    General: AAOx3 Cardiovascular:S1S2/RRR Respiratory: Few right basilar rhonchi  Discharge Instructions   Discharge Instructions    Diet - low sodium heart healthy   Complete by:  As directed    Increase activity slowly   Complete by:  As directed      Allergies as of 10/05/2018   No Known Allergies     Medication List    TAKE these medications   cefdinir 300 MG capsule Commonly known as:  OMNICEF Take 1 capsule (300 mg total) by mouth 2 (two) times daily for 4 days.   levothyroxine 137 MCG tablet Commonly known as:  SYNTHROID, LEVOTHROID Take 137 mcg by mouth daily before breakfast.   nicotine 14 mg/24hr patch Commonly known as:  NICODERM CQ - dosed in mg/24 hours Place 1 patch (14 mg total) onto the skin daily.   predniSONE 20 MG tablet Commonly known as:  DELTASONE Take 1-2 tablets (20-40 mg total) by mouth daily with breakfast. Take 40mg  daily for 2days then 20mg  daily for 2days then STOP      No Known Allergies Follow-up Information    Mila PalmerWolters, Sharon, MD Follow up in 1 week(s).   Specialty:  Family Medicine Why:  please FU VItamin B12 level, recheck Vitamin D level, Repeat CXR in 4-6weeks and Get Mammogram to Follow R Breast cyst Contact information: [address] [place] [address] [phone]  The results of significant diagnostics from this hospitalization (including imaging, microbiology, ancillary and laboratory) are listed below for reference.    Significant Diagnostic Studies: Dg Chest 2 View  Result Date: 10/04/2018 CLINICAL DATA:  Shortness of breath. Completed antibiotics 2 days ago. EXAM: CHEST - 2 VIEW COMPARISON:  None. FINDINGS: Lungs are adequately inflated with possible mild linear density  over the right middle lobe versus vascular crowding. No effusion. Cardiomediastinal silhouette and remainder the exam is within normal. IMPRESSION: Subtle nonspecific linear density over the right middle lobe on the lateral film which may be due vascular crowding, atelectasis or infection. Electronically Signed   By: Elberta Fortis M.D.   On: 10/04/2018 16:17   Ct Angio Chest Pe W/cm &/or Wo Cm  Result Date: 10/04/2018 CLINICAL DATA:  Shortness of Breath, history of upper respiratory infection EXAM: CT ANGIOGRAPHY CHEST WITH CONTRAST TECHNIQUE: Multidetector CT imaging of the chest was performed using the standard protocol during bolus administration of intravenous contrast. Multiplanar CT image reconstructions and MIPs were obtained to evaluate the vascular anatomy. CONTRAST:  75 mL Isovue 370. COMPARISON:  Plain film from earlier in the same day. FINDINGS: Cardiovascular: Thoracic aorta shows no aneurysmal dilatation dissection. No cardiac enlargement is seen. The pulmonary artery shows a normal branching pattern. No filling defect to suggest pulmonary embolism is noted. Mild coronary calcifications are noted. Mediastinum/Nodes: The esophagus is within normal limits. No hilar or mediastinal adenopathy is noted. The thoracic inlet is within normal limits. Lungs/Pleura: Lungs are well aerated bilaterally. No sizable effusion is noted. Minimal patchy infiltrative changes noted in the right upper lobe consistent with the history of recent upper respiratory infection. No pneumothorax is seen. Minimal scarring in the right lung base is noted laterally. Upper Abdomen: Visualized upper abdomen is within normal limits. Musculoskeletal: No acute bony abnormality is noted. No compression deformity is seen. In the right breast there is a hypodense mass lesion which measures 4.4 cm in greatest dimension. This may represent a large breast cyst. Nonemergent workup is recommended. Review of the MIP images confirms the above  findings. IMPRESSION: No evidence of pulmonary emboli. Patchy infiltrative changes in the right upper lobe consistent with the history of resolving infection. Hypodense lesion within the right breast measuring 4.4 cm which may represent a large breast cyst. Nonemergent workup is recommended. Electronically Signed   By: Alcide Clever M.D.   On: 10/04/2018 20:03    Microbiology: No results found for this or any previous visit (from the past 240 hour(s)).   Labs: Basic Metabolic Panel: Recent Labs  Lab 10/04/18 1545 10/04/18 1842 10/05/18 0227  NA 142  --  142  K 2.7*  --  3.6  CL 106  --  111  CO2 26  --  19*  GLUCOSE 115*  --  198*  BUN 7  --  7  CREATININE 1.01*  --  1.06*  CALCIUM 9.0  --  8.7*  MG  --  2.5*  --    Liver Function Tests: No results for input(s): AST, ALT, ALKPHOS, BILITOT, PROT, ALBUMIN in the last 168 hours. No results for input(s): LIPASE, AMYLASE in the last 168 hours. No results for input(s): AMMONIA in the last 168 hours. CBC: Recent Labs  Lab 10/04/18 1545 10/05/18 0227  WBC 7.0 8.8  HGB 13.0 11.4*  HCT 39.4 33.6*  MCV 92.7 92.8  PLT 288 268   Cardiac Enzymes: Recent Labs  Lab 10/05/18 0740  TROPONINI <0.03   BNP: BNP (last  3 results) No results for input(s): BNP in the last 8760 hours.  ProBNP (last 3 results) No results for input(s): PROBNP in the last 8760 hours.  CBG: No results for input(s): GLUCAP in the last 168 hours.     Signed:  Zannie Cove MD.  [facility] 10/05/2018, 3:18 PM

## 2018-10-05 NOTE — ED Notes (Signed)
Breakfast Tray Ordered. 

## 2018-10-06 LAB — LEGIONELLA PNEUMOPHILA SEROGP 1 UR AG: L. pneumophila Serogp 1 Ur Ag: NEGATIVE

## 2018-10-08 LAB — CULTURE, RESPIRATORY W GRAM STAIN: Culture: NORMAL

## 2018-10-08 LAB — CULTURE, RESPIRATORY

## 2018-10-11 DIAGNOSIS — Z803 Family history of malignant neoplasm of breast: Secondary | ICD-10-CM | POA: Diagnosis not present

## 2018-10-11 DIAGNOSIS — Z79899 Other long term (current) drug therapy: Secondary | ICD-10-CM | POA: Diagnosis not present

## 2018-10-11 DIAGNOSIS — Z0000 Encounter for general adult medical examination without abnormal findings: Secondary | ICD-10-CM | POA: Diagnosis not present

## 2018-10-11 DIAGNOSIS — E538 Deficiency of other specified B group vitamins: Secondary | ICD-10-CM | POA: Diagnosis not present

## 2018-10-11 DIAGNOSIS — Z1231 Encounter for screening mammogram for malignant neoplasm of breast: Secondary | ICD-10-CM | POA: Diagnosis not present

## 2018-10-11 DIAGNOSIS — E039 Hypothyroidism, unspecified: Secondary | ICD-10-CM | POA: Diagnosis not present

## 2018-10-11 DIAGNOSIS — E559 Vitamin D deficiency, unspecified: Secondary | ICD-10-CM | POA: Diagnosis not present

## 2018-10-25 DIAGNOSIS — E538 Deficiency of other specified B group vitamins: Secondary | ICD-10-CM | POA: Diagnosis not present

## 2018-11-10 ENCOUNTER — Other Ambulatory Visit: Payer: Self-pay

## 2018-11-10 ENCOUNTER — Ambulatory Visit
Admission: RE | Admit: 2018-11-10 | Discharge: 2018-11-10 | Disposition: A | Discharge status: Home / Self Care | Payer: BLUE CROSS/BLUE SHIELD | Source: Ambulatory Visit | Attending: Family Medicine | Admitting: Family Medicine

## 2018-11-10 ENCOUNTER — Other Ambulatory Visit: Payer: Self-pay | Admitting: Family Medicine

## 2018-11-10 DIAGNOSIS — J189 Pneumonia, unspecified organism: Secondary | ICD-10-CM

## 2018-11-16 DIAGNOSIS — E538 Deficiency of other specified B group vitamins: Secondary | ICD-10-CM | POA: Diagnosis not present

## 2018-11-16 DIAGNOSIS — E039 Hypothyroidism, unspecified: Secondary | ICD-10-CM | POA: Diagnosis not present

## 2019-01-31 DIAGNOSIS — E559 Vitamin D deficiency, unspecified: Secondary | ICD-10-CM | POA: Diagnosis not present

## 2019-01-31 DIAGNOSIS — E039 Hypothyroidism, unspecified: Secondary | ICD-10-CM | POA: Diagnosis not present

## 2019-11-03 IMAGING — CT CT ANGIO CHEST
2 of 9 series · 18 of 46 positions shown · IV contrast (isovue)
Comparison: Plain film from earlier in the same day.

CLINICAL DATA: Shortness of Breath, history of upper respiratory
infection

EXAM:
CT ANGIOGRAPHY CHEST WITH CONTRAST
TECHNIQUE: Multidetector CT imaging of the chest was performed using the
standard protocol during bolus administration of intravenous
contrast. Multiplanar CT image reconstructions and MIPs were
obtained to evaluate the vascular anatomy.
CONTRAST:  75 mL Isovue 370.

[Series 6: thins · axial · 0.88mm/px · z∈[+1075,+1328]mm · 15 of 285 slices shown]
[im 16/285  lung]
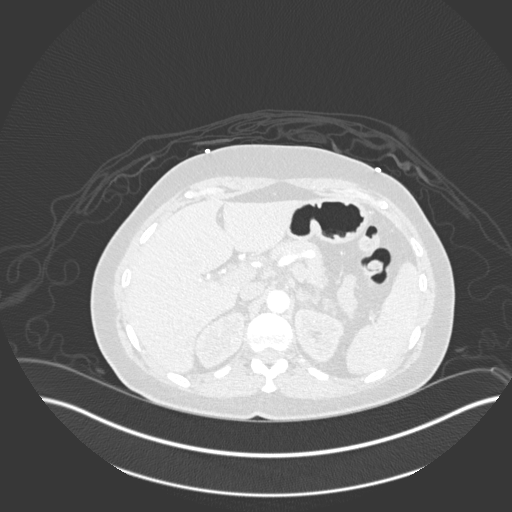
[im 32/285  soft-tissue]
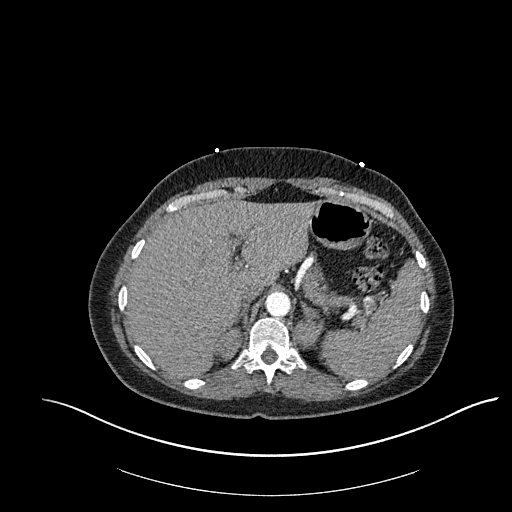
[im 48/285  lung]
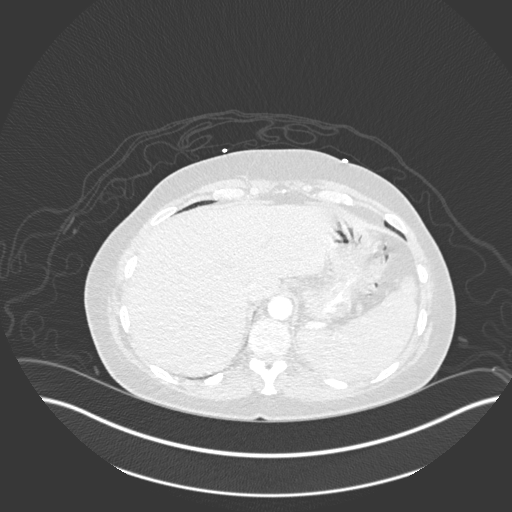
[im 64/285  soft-tissue]
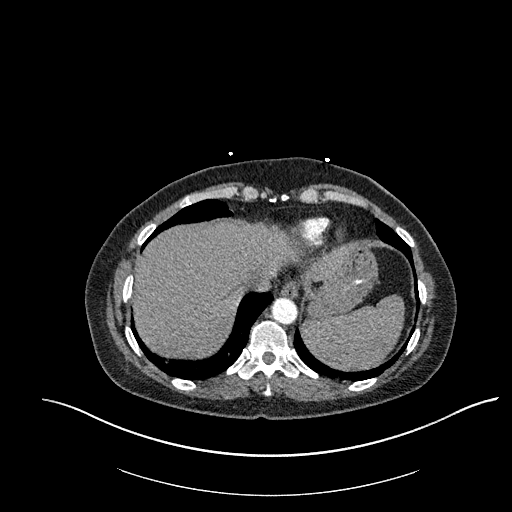
[im 95/285  lung]
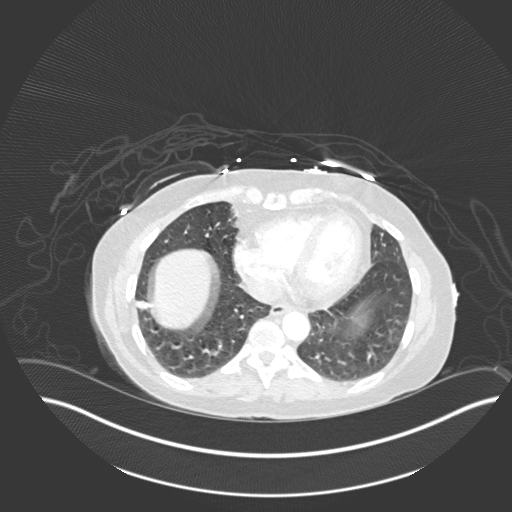
[im 111/285  soft-tissue]
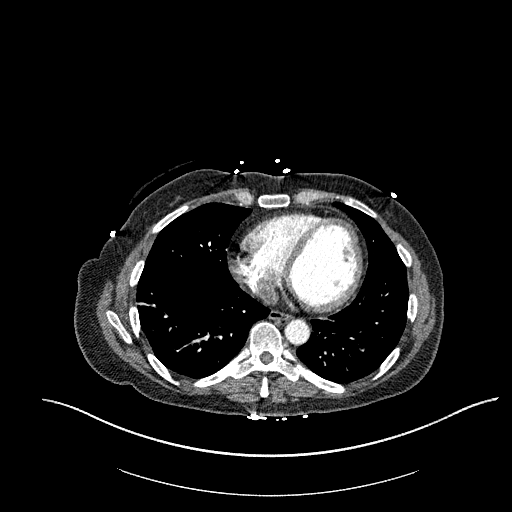
[im 127/285  lung]
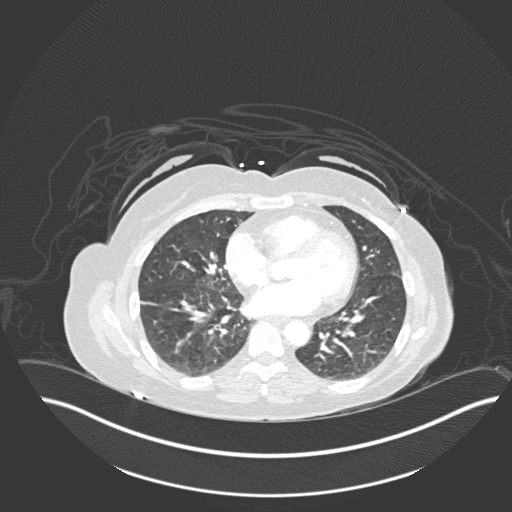
[im 143/285  soft-tissue]
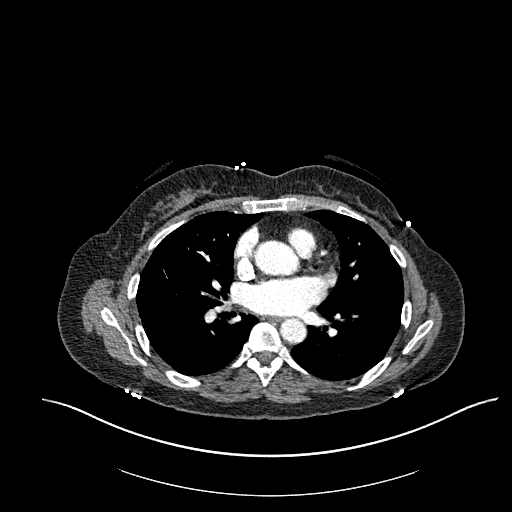
[im 158/285  lung]
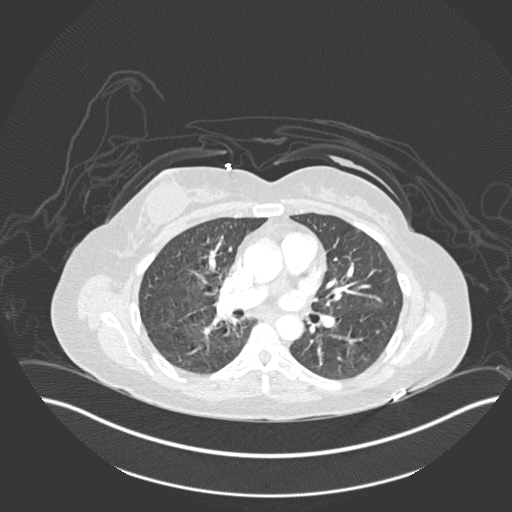
[im 174/285  soft-tissue]
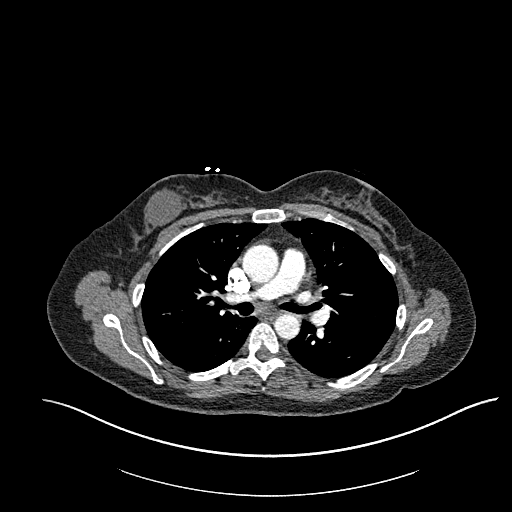
[im 190/285  lung]
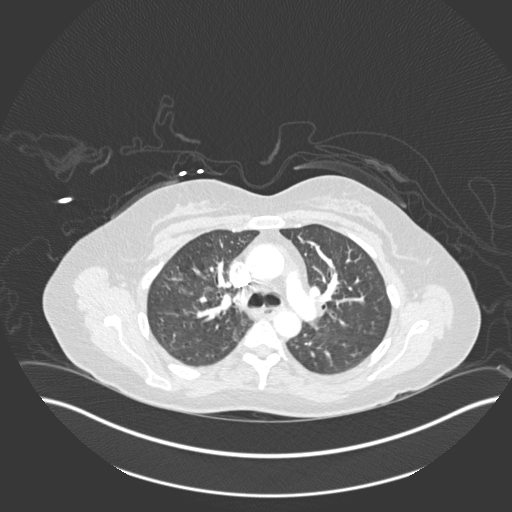
[im 221/285  soft-tissue]
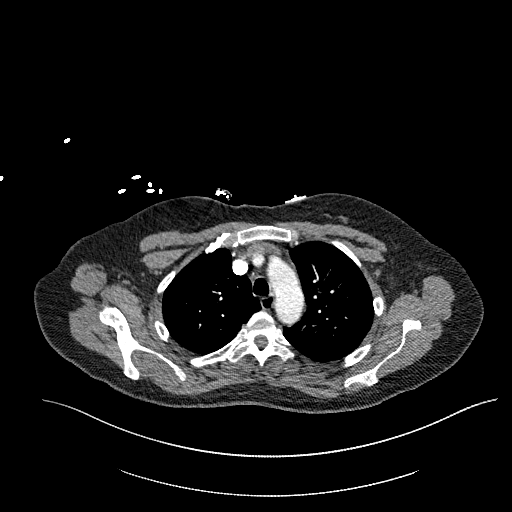
[im 237/285  lung]
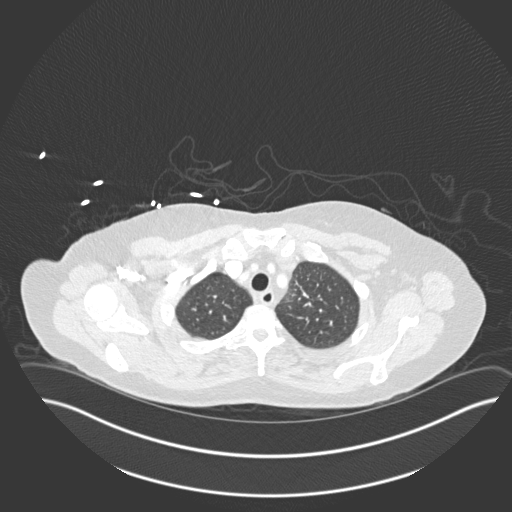
[im 253/285  soft-tissue]
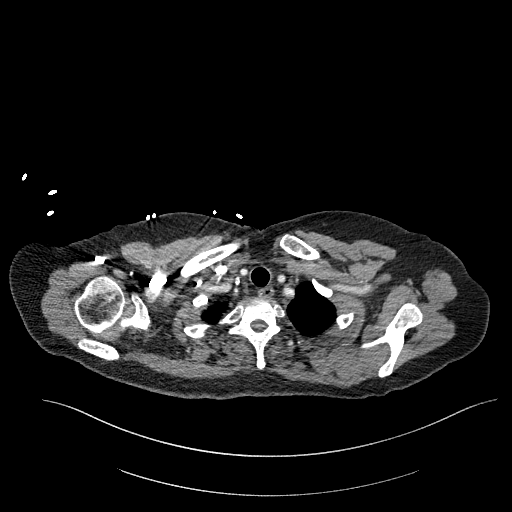
[im 269/285  lung]
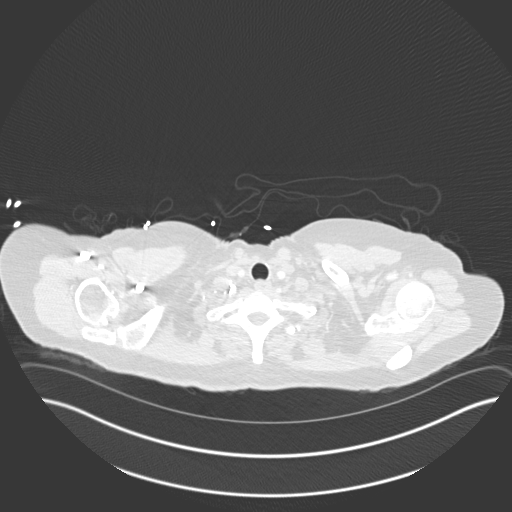

[Series 8: coronal mpr · coronal · 0.53mm/px · 3 of 113 slices shown]
[im 29/113  soft-tissue]
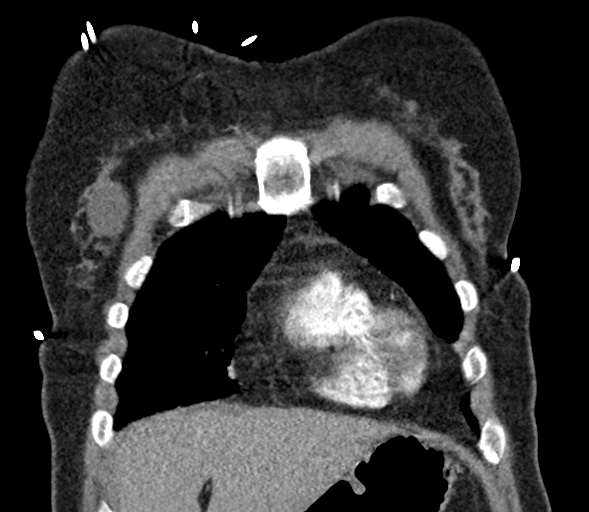
[im 57/113  soft-tissue]
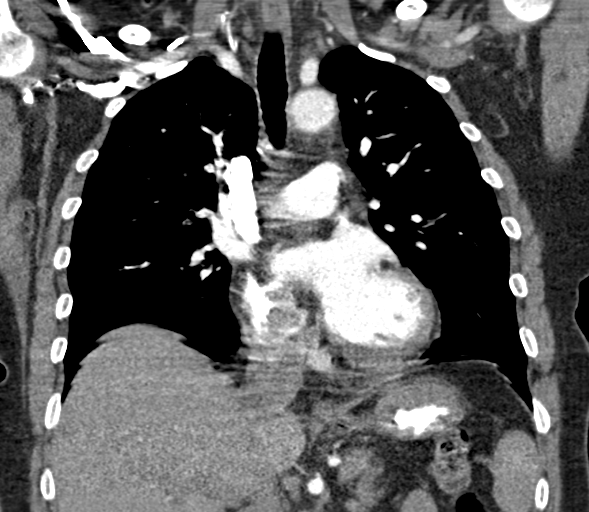
[im 85/113  soft-tissue]
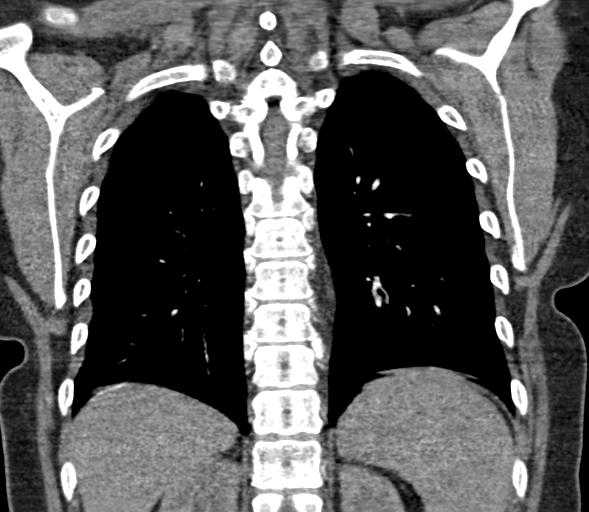

[18 of 46 positions shown; findings below may reference images not displayed]

FINDINGS: Cardiovascular: Thoracic aorta shows no aneurysmal dilatation
dissection. No cardiac enlargement is seen. The pulmonary artery
shows a normal branching pattern. No filling defect to suggest
pulmonary embolism is noted. Mild coronary calcifications are noted.

Mediastinum/Nodes: The esophagus is within normal limits. No hilar
or mediastinal adenopathy is noted. The thoracic inlet is within
normal limits.

Lungs/Pleura: Lungs are well aerated bilaterally. No sizable
effusion is noted. Minimal patchy infiltrative changes noted in the
right upper lobe consistent with the history of recent upper
respiratory infection. No pneumothorax is seen. Minimal scarring in
the right lung base is noted laterally.

Upper Abdomen: Visualized upper abdomen is within normal limits.

Musculoskeletal: No acute bony abnormality is noted. No compression
deformity is seen. In the right breast there is a hypodense mass
lesion which measures 4.4 cm in greatest dimension. This may
represent a large breast cyst. Nonemergent workup is recommended.

Review of the MIP images confirms the above findings.
IMPRESSION: No evidence of pulmonary emboli.

Patchy infiltrative changes in the right upper lobe consistent with
the history of resolving infection.

Hypodense lesion within the right breast measuring 4.4 cm which may
represent a large breast cyst. Nonemergent workup is recommended.

## 2019-11-09 DIAGNOSIS — E538 Deficiency of other specified B group vitamins: Secondary | ICD-10-CM | POA: Diagnosis not present

## 2019-11-09 DIAGNOSIS — E559 Vitamin D deficiency, unspecified: Secondary | ICD-10-CM | POA: Diagnosis not present

## 2019-11-09 DIAGNOSIS — Z13220 Encounter for screening for lipoid disorders: Secondary | ICD-10-CM | POA: Diagnosis not present

## 2019-11-09 DIAGNOSIS — Z0000 Encounter for general adult medical examination without abnormal findings: Secondary | ICD-10-CM | POA: Diagnosis not present

## 2019-11-09 DIAGNOSIS — Z1231 Encounter for screening mammogram for malignant neoplasm of breast: Secondary | ICD-10-CM | POA: Diagnosis not present

## 2019-11-09 DIAGNOSIS — E039 Hypothyroidism, unspecified: Secondary | ICD-10-CM | POA: Diagnosis not present

## 2019-11-09 DIAGNOSIS — Z79899 Other long term (current) drug therapy: Secondary | ICD-10-CM | POA: Diagnosis not present

## 2019-11-12 DIAGNOSIS — Z23 Encounter for immunization: Secondary | ICD-10-CM | POA: Diagnosis not present

## 2019-12-10 DIAGNOSIS — Z23 Encounter for immunization: Secondary | ICD-10-CM | POA: Diagnosis not present

## 2020-11-12 ENCOUNTER — Other Ambulatory Visit (HOSPITAL_COMMUNITY)
Admission: RE | Admit: 2020-11-12 | Discharge: 2020-11-12 | Disposition: A | Discharge status: Home / Self Care | Payer: BC Managed Care – PPO | Source: Ambulatory Visit | Attending: Family Medicine | Admitting: Family Medicine

## 2020-11-12 ENCOUNTER — Other Ambulatory Visit: Payer: Self-pay | Admitting: Family Medicine

## 2020-11-12 DIAGNOSIS — Z13220 Encounter for screening for lipoid disorders: Secondary | ICD-10-CM | POA: Diagnosis not present

## 2020-11-12 DIAGNOSIS — Z79899 Other long term (current) drug therapy: Secondary | ICD-10-CM | POA: Diagnosis not present

## 2020-11-12 DIAGNOSIS — E039 Hypothyroidism, unspecified: Secondary | ICD-10-CM | POA: Diagnosis not present

## 2020-11-12 DIAGNOSIS — E538 Deficiency of other specified B group vitamins: Secondary | ICD-10-CM | POA: Diagnosis not present

## 2020-11-12 DIAGNOSIS — E559 Vitamin D deficiency, unspecified: Secondary | ICD-10-CM | POA: Diagnosis not present

## 2020-11-12 DIAGNOSIS — Z0000 Encounter for general adult medical examination without abnormal findings: Secondary | ICD-10-CM | POA: Diagnosis not present

## 2020-11-12 DIAGNOSIS — Z01411 Encounter for gynecological examination (general) (routine) with abnormal findings: Secondary | ICD-10-CM | POA: Insufficient documentation

## 2020-11-12 DIAGNOSIS — Z23 Encounter for immunization: Secondary | ICD-10-CM | POA: Diagnosis not present

## 2020-11-13 ENCOUNTER — Other Ambulatory Visit: Payer: Self-pay | Admitting: Family Medicine

## 2020-11-13 DIAGNOSIS — E2839 Other primary ovarian failure: Secondary | ICD-10-CM

## 2020-11-14 LAB — CYTOLOGY - PAP
Comment: NEGATIVE
Diagnosis: NEGATIVE
High risk HPV: NEGATIVE

## 2020-11-21 DIAGNOSIS — Z1231 Encounter for screening mammogram for malignant neoplasm of breast: Secondary | ICD-10-CM | POA: Diagnosis not present

## 2020-11-21 DIAGNOSIS — Z780 Asymptomatic menopausal state: Secondary | ICD-10-CM | POA: Diagnosis not present

## 2020-11-21 DIAGNOSIS — M8589 Other specified disorders of bone density and structure, multiple sites: Secondary | ICD-10-CM | POA: Diagnosis not present

## 2020-12-05 DIAGNOSIS — E538 Deficiency of other specified B group vitamins: Secondary | ICD-10-CM | POA: Diagnosis not present

## 2020-12-18 DIAGNOSIS — E538 Deficiency of other specified B group vitamins: Secondary | ICD-10-CM | POA: Diagnosis not present

## 2020-12-24 DIAGNOSIS — E039 Hypothyroidism, unspecified: Secondary | ICD-10-CM | POA: Diagnosis not present

## 2020-12-24 DIAGNOSIS — E538 Deficiency of other specified B group vitamins: Secondary | ICD-10-CM | POA: Diagnosis not present

## 2021-01-30 DIAGNOSIS — E039 Hypothyroidism, unspecified: Secondary | ICD-10-CM | POA: Diagnosis not present

## 2022-09-29 DIAGNOSIS — E039 Hypothyroidism, unspecified: Secondary | ICD-10-CM | POA: Diagnosis not present

## 2022-10-02 DIAGNOSIS — K649 Unspecified hemorrhoids: Secondary | ICD-10-CM | POA: Diagnosis not present

## 2023-05-25 DIAGNOSIS — Z13220 Encounter for screening for lipoid disorders: Secondary | ICD-10-CM | POA: Diagnosis not present

## 2023-05-25 DIAGNOSIS — F17210 Nicotine dependence, cigarettes, uncomplicated: Secondary | ICD-10-CM | POA: Diagnosis not present

## 2023-05-25 DIAGNOSIS — Z79899 Other long term (current) drug therapy: Secondary | ICD-10-CM | POA: Diagnosis not present

## 2023-05-25 DIAGNOSIS — E039 Hypothyroidism, unspecified: Secondary | ICD-10-CM | POA: Diagnosis not present

## 2023-05-25 DIAGNOSIS — E559 Vitamin D deficiency, unspecified: Secondary | ICD-10-CM | POA: Diagnosis not present

## 2023-05-25 DIAGNOSIS — Z0000 Encounter for general adult medical examination without abnormal findings: Secondary | ICD-10-CM | POA: Diagnosis not present

## 2023-06-01 ENCOUNTER — Other Ambulatory Visit: Payer: Self-pay | Admitting: Family Medicine

## 2023-06-01 DIAGNOSIS — F17210 Nicotine dependence, cigarettes, uncomplicated: Secondary | ICD-10-CM

## 2023-06-12 ENCOUNTER — Ambulatory Visit
Admission: RE | Admit: 2023-06-12 | Discharge: 2023-06-12 | Disposition: A | Discharge status: Home / Self Care | Payer: BC Managed Care – PPO | Source: Ambulatory Visit | Attending: Family Medicine | Admitting: Family Medicine

## 2023-06-12 DIAGNOSIS — F17210 Nicotine dependence, cigarettes, uncomplicated: Secondary | ICD-10-CM

## 2023-07-07 ENCOUNTER — Other Ambulatory Visit: Payer: Self-pay | Admitting: Family Medicine

## 2023-07-07 DIAGNOSIS — Z1231 Encounter for screening mammogram for malignant neoplasm of breast: Secondary | ICD-10-CM

## 2023-08-17 DIAGNOSIS — Z1231 Encounter for screening mammogram for malignant neoplasm of breast: Secondary | ICD-10-CM | POA: Diagnosis not present

## 2023-09-09 DIAGNOSIS — E7800 Pure hypercholesterolemia, unspecified: Secondary | ICD-10-CM | POA: Diagnosis not present

## 2024-04-12 DIAGNOSIS — G2581 Restless legs syndrome: Secondary | ICD-10-CM | POA: Diagnosis not present

## 2024-04-12 DIAGNOSIS — R252 Cramp and spasm: Secondary | ICD-10-CM | POA: Diagnosis not present

## 2024-04-12 DIAGNOSIS — Z122 Encounter for screening for malignant neoplasm of respiratory organs: Secondary | ICD-10-CM | POA: Diagnosis not present

## 2024-05-26 ENCOUNTER — Other Ambulatory Visit: Payer: Self-pay | Admitting: Family Medicine

## 2024-05-26 DIAGNOSIS — Z122 Encounter for screening for malignant neoplasm of respiratory organs: Secondary | ICD-10-CM

## 2024-06-14 ENCOUNTER — Ambulatory Visit
Admission: RE | Admit: 2024-06-14 | Discharge: 2024-06-14 | Disposition: A | Discharge status: Home / Self Care | Source: Ambulatory Visit | Attending: Family Medicine | Admitting: Family Medicine

## 2024-06-14 DIAGNOSIS — Z122 Encounter for screening for malignant neoplasm of respiratory organs: Secondary | ICD-10-CM

## 2024-06-14 DIAGNOSIS — Z87891 Personal history of nicotine dependence: Secondary | ICD-10-CM | POA: Diagnosis not present

## 2024-06-16 DIAGNOSIS — R5383 Other fatigue: Secondary | ICD-10-CM | POA: Diagnosis not present

## 2024-06-16 DIAGNOSIS — I2510 Atherosclerotic heart disease of native coronary artery without angina pectoris: Secondary | ICD-10-CM | POA: Diagnosis not present

## 2024-06-16 DIAGNOSIS — E7800 Pure hypercholesterolemia, unspecified: Secondary | ICD-10-CM | POA: Diagnosis not present

## 2024-06-16 DIAGNOSIS — Z0000 Encounter for general adult medical examination without abnormal findings: Secondary | ICD-10-CM | POA: Diagnosis not present

## 2024-06-16 DIAGNOSIS — E039 Hypothyroidism, unspecified: Secondary | ICD-10-CM | POA: Diagnosis not present

## 2024-06-16 DIAGNOSIS — E538 Deficiency of other specified B group vitamins: Secondary | ICD-10-CM | POA: Diagnosis not present

## 2024-06-16 DIAGNOSIS — E559 Vitamin D deficiency, unspecified: Secondary | ICD-10-CM | POA: Diagnosis not present

## 2024-07-26 NOTE — Progress Notes (Unsigned)
 Cardiology Office Note:  .   Date:  07/27/2024 ID:  Kayla Ponce, DOB October 07, 1965, MRN [id number] PCP: Dartha Geralds, DO [facility] Providers Cardiologist:  None   Patient Profile: .      PMH Hypothyroidism Hyperlipidemia Aortic atherosclerosis Coronary artery calcification Age advanced left anterior descending coronary artery calcification  on CT 06/14/24 Emphysema Former smoker Quit 2024 - 30 pack year history     History of Present Illness: .   Discussed the use of AI scribe software for clinical note transcription with the patient, who gave verbal consent to proceed.  History of Present Illness Kayla Ponce is a very pleasant 58 year old female who presents today for evaluation of coronary calcification noted in the left anterior descending artery on CT from October described as advanced.  This was initially noted on CT in 2024 and she has been on rosuvastatin since that time.  History of smoking with 30-pack-year history, she quit smoking 1 year ago. She has worsening shortness of breath with minimal exertion such as climbing stairs or into bed. She also has right-sided chest discomfort that feels like heartburn and radiates to her back, usually at night while she is watching TV, sometimes with spasms near the shoulder blades.  She denies association of palpitations, diaphoresis, or n/v. She works as a investment banker, corporate which involves some walking during the day but she does not participate in any formal exercise.  She reports weight gain that she attributes to decreased physical activity as she feels she used to be more active in the past. Family history is notable for her mother having a stroke, a brother with myocardial infarction at 18, and a sister with lower extremity stents. Her father died from lung cancer and another brother from emphysema.  Also takes aspirin 81 mg daily.  She denies orthopnea, edema, PND, presyncope, syncope.  Family history: Her family history is  not on file.  Mother had stroke, never recovered, passed away age 43 Brother had MI age 60 Sister has LE stenting  Discussed the use of AI scribe software for clinical note transcription with the patient, who gave verbal consent to proceed.  ASCVD (Atherosclerotic Cardiovascular Disease) Risk Algorithm including Known ASCVD from AHA/ACC from [email or web address] on 07/26/2024 ** All calculations should be rechecked by clinician prior to use **  RESULT SUMMARY: 4.2 % Risk of cardiovascular event (coronary or stroke death or non-fatal MI or stroke) in next 10 years.  Note: These estimates may underestimate or overestimate the 10-year risk for some race/ethnic groups. See Evidence section for more information.  No statin recommended because 10-year risk <5%; always encourage healthy cardiovascular lifestyle choices. Some patients with other high risk features may still be appropriate for treatment.  INPUTS: History of ASCVD --> 0 = No LDL Cholesterol >=190mg /dL (5.07 mmol/L) --> 0 = No Age --> 58 years Diabetes --> 0 = No Sex --> 0 = Female Total Cholesterol --> 138 mg/dL HDL Cholesterol --> 58 mg/dL Systolic Blood Pressure --> 117 mm Hg Treatment for Hypertension --> 0 = No Smoker --> 1 = Yes Race --> 3 = Other  Diet: Most often home-cooked meals Occasional fast food or restaurant food Coffee and tea with Splenda No ETOH  Activity: Works in therapist, sports in an apartment complex  Walks some at work, no additional exercise  No results found for: LIPOA    ROS: + shortness of breath       Studies Reviewed: SABRA   EKG Interpretation Date/Time:  Wednesday July 27 2024 14:43:05 EST Ventricular Rate:  70 PR Interval:  170 QRS Duration:  84 QT Interval:  380 QTC Calculation: 410 R Axis:   77  Text Interpretation: Normal sinus rhythm Nonspecific ST abnormality When compared with ECG of 04-Oct-2018 15:32, T wave inversion no longer evident in Inferior leads T wave inversion  no longer evident in Lateral leads Confirmed by Percy Browning [phone]) on 07/27/2024 2:48:12 PM      Risk Assessment/Calculations:     HYPERTENSION CONTROL Vitals:   07/27/24 1439 07/27/24 1613  BP: (!) 140/70 (!) 140/80    The patient's blood pressure is elevated above target today.  In order to address the patient's elevated BP: Blood pressure will be monitored at home to determine if medication changes need to be made.          Physical Exam:   VS: BP (!) 140/80   Pulse 74   Ht 5' 4 (1.626 m)   Wt 169 lb 3.2 oz (76.7 kg)   SpO2 97%   BMI 29.04 kg/m   Wt Readings from Last 3 Encounters:  07/27/24 169 lb 3.2 oz (76.7 kg)  10/04/18 158 lb (71.7 kg)  03/15/18 165 lb (74.8 kg)     GEN: Well nourished, well developed in no acute distress NECK: No JVD; No carotid bruits CARDIAC: RRR, no murmurs, rubs, gallops RESPIRATORY:  Clear to auscultation without rales, wheezing or rhonchi  ABDOMEN: Soft, non-tender, non-distended EXTREMITIES:  No edema; No deformity     ASSESSMENT AND PLAN: .    Assessment & Plan Coronary artery calcification Cardiac risk Shortness of breath CT scan reveals advanced calcification in the left anterior descending artery, suggesting significant plaque buildup. EKG today reveals NSR, nonspecific ST abnormality.  Notes worsening shortness of breath with minimal exertion and right-sided chest pain that most often occurs at rest. Further investigation is necessary to evaluate calcification and potential soft plaque.  She has additional risk factors of former tobacco use, and hyperlipidemia as well as significant family history of ASCVD.  Admits diet is fairly unrestricted.  She does some walking during her workday but no formal exercise.  Will proceed with further ischemia evaluation. - Coronary CT for ischemia evaluation and mapping of coronary anatomy -Lopressor 50 mg 2 hours prior to CT - Continue aspirin and rosuvastatin - Heart healthy, whole food  diet avoiding saturated fat, processed foods, simple carbohydrates, and sugar  - Aim to be physically active everyday and at least 150 minutes of moderate intensity exercise each week    Hyperlipidemia LDL goal 70 Lipid panel 06/16/2024 with total cholesterol 138, HDL 58, triglycerides 89, and LDL-C 64.  Lipids are well-controlled.  She is taking rosuvastatin 5 mg daily and is not having any concerning side effects. -Continue rosuvastatin - Heart healthy diet avoiding processed foods, saturated fat, sugar, and other simple carbohydrates recommended - Aim to increase regular physical activity  Elevated blood pressure reading Blood pressure was elevated at 140/80 during the visit, though she reports normal readings at home. She is not currently on antihypertensive medication.  - Recommend routine home BP monitoring for goal < 130/80 - Report elevated BP readings to us  or PCP       Dispo: 3 months with me  Signed, Browning Percy, NP-C

## 2024-07-27 ENCOUNTER — Ambulatory Visit (HOSPITAL_BASED_OUTPATIENT_CLINIC_OR_DEPARTMENT_OTHER): Admitting: Nurse Practitioner

## 2024-07-27 ENCOUNTER — Encounter (HOSPITAL_BASED_OUTPATIENT_CLINIC_OR_DEPARTMENT_OTHER): Payer: Self-pay | Admitting: Nurse Practitioner

## 2024-07-27 VITALS — BP 140/80 | HR 74 | Ht 64.0 in | Wt 169.2 lb

## 2024-07-27 DIAGNOSIS — R030 Elevated blood-pressure reading, without diagnosis of hypertension: Secondary | ICD-10-CM

## 2024-07-27 DIAGNOSIS — R931 Abnormal findings on diagnostic imaging of heart and coronary circulation: Secondary | ICD-10-CM

## 2024-07-27 DIAGNOSIS — R0602 Shortness of breath: Secondary | ICD-10-CM

## 2024-07-27 DIAGNOSIS — E785 Hyperlipidemia, unspecified: Secondary | ICD-10-CM | POA: Diagnosis not present

## 2024-07-27 DIAGNOSIS — Z7189 Other specified counseling: Secondary | ICD-10-CM

## 2024-07-27 MED ORDER — METOPROLOL TARTRATE 50 MG PO TABS: 50.0000 mg | ORAL_TABLET | Freq: Once | ORAL | 0 refills | Status: AC

## 2024-07-27 NOTE — Patient Instructions (Signed)
 Medication Instructions:   Your physician recommends that you continue on your current medications as directed. Please refer to the Current Medication list given to you today.   *If you need a refill on your cardiac medications before your next appointment, please call your pharmacy*  Lab Work:  None ordered.  If you have labs (blood work) drawn today and your tests are completely normal, you will receive your results only by: MyChart Message (if you have MyChart) OR A paper copy in the mail If you have any lab test that is abnormal or we need to change your treatment, we will call you to review the results.  Testing/Procedures:    Your cardiac CT will be scheduled at one of the below locations:    [facility] [address] [place], [place] [address] [phone]  If scheduled at [facility], please arrive 30 minutes early for check-in and test prep.  Please follow these instructions carefully (unless otherwise directed):  An IV will be required for this test and Nitroglycerin will be given.    On the Night Before the Test: Be sure to Drink plenty of water. Do not consume any caffeinated/decaffeinated beverages or chocolate 12 hours prior to your test. Do not take any antihistamines 12 hours prior to your test.  On the Day of the Test: Drink plenty of water until 1 hour prior to the test. Do not eat any food 1 hour prior to test. You may take your regular medications prior to the test.  Take metoprolol (Lopressor) one (1) tablet by mouth (50 mg ) two hours prior to test. FEMALES- please wear underwire-free bra if available, avoid dresses & tight clothing      After the Test: Drink plenty of water. After receiving IV contrast, you may experience a mild flushed feeling. This is normal. On occasion, you may experience a mild rash up to 24 hours after the test. This is not dangerous. If this occurs, you can take Benadryl 25 mg, Zyrtec, Claritin,  or Allegra and increase your fluid intake. (Patients taking Tikosyn should avoid Benadryl, and may take Zyrtec, Claritin, or Allegra) If you experience trouble breathing, this can be serious. If it is severe call 911 IMMEDIATELY. If it is mild, please call our office.  We will call to schedule your test 2-4 weeks out understanding that some insurance companies will need an authorization prior to the service being performed.   For more information and frequently asked questions, please visit our website : [email or web address]  For non-scheduling related questions, please contact the cardiac imaging nurse navigator should you have any questions/concerns: Cardiac Imaging Nurse Navigators Direct Office Dial: [phone]   For scheduling needs, including cancellations and rescheduling, please call Brittany, [phone].   Follow-Up: At [facility], you and your health needs are our priority.  As part of our continuing mission to provide you with exceptional heart care, our providers are all part of one team.  This team includes your primary Cardiologist (physician) and Advanced Practice Providers or APPs (Physician Assistants and Nurse Practitioners) who all work together to provide you with the care you need, when you need it.  Your next appointment:   3 month(s)  Provider:   Rosaline Bane, NP    We recommend signing up for the patient portal called MyChart.  Sign up information is provided on this After Visit Summary.  MyChart is used to connect with patients for Virtual Visits (Telemedicine).  Patients are able to  view lab/test results, encounter notes, upcoming appointments, etc.  Non-urgent messages can be sent to your provider as well.   To learn more about what you can do with MyChart, go to [email or web address].   Other Instructions  Adopting a Healthy Lifestyle.   Weight: Know what a healthy weight is for you (roughly BMI <25) and aim to maintain  this. You can calculate your body mass index on your smart phone. Unfortunately, this is not the most accurate measure of healthy weight, but it is the simplest measurement to use. A more accurate measurement involves body scanning which measures lean muscle, fat tissue and bony density. We do not have this equipment at [facility].    Diet: Aim for 7+ servings of fruits and vegetables daily Limit animal fats in diet for cholesterol and heart health - choose grass fed whenever available Avoid highly processed foods (fast food burgers, tacos, fried chicken, pizza, hot dogs, french fries)  Saturated fat comes in the form of butter, lard, coconut oil, margarine, partially hydrogenated oils, dairy products, and fat in meat. These increase your risk of cardiovascular disease.  Use healthy plant oils, such as olive, canola, soy, corn, sunflower and peanut.  Whole foods such as fruits, vegetables and whole grains have fiber  Men need > 38 grams of fiber per day Women need > 25 grams of fiber per day  Load up on vegetables and fruits - one-half of your plate: Aim for color and variety, and remember that potatoes dont count. Go for whole grains - one-quarter of your plate: Whole wheat, barley, wheat berries, quinoa, oats, brown rice, and foods made with them. If you want pasta, go with whole wheat pasta. Protein power - one-quarter of your plate: Fish, chicken, beans, and nuts are all healthy, versatile protein sources. Limit red meat. You need carbohydrates for energy! The type of carbohydrate is more important than the amount. Choose carbohydrates such as vegetables, fruits, whole grains, beans, and nuts in the place of white rice, white pasta, potatoes (baked or fried), macaroni and cheese, cakes, cookies, and donuts.  If youre thirsty, drink water. Coffee and tea are good in moderation, but skip sugary drinks and limit milk and dairy products to one or two daily servings. Keep sugar intake at 6 teaspoons  or 24 grams or LESS       Exercise: Aim for 150 min of moderate intensity exercise weekly for heart health, and weights twice weekly for bone health Stay active - any steps are better than no steps! Aim for 7-9 hours of sleep daily   Sleep: This provides your body with the reset and relaxation that it needs!  Aim to get 7-8 hours of sleep each night. Limit caffeine, screen time, and other distractions prior to bedtime.  Keep your bedroom cool and dark and do not wear heavy clothing to bed or use heavy bed covers - layer if needed.         Mediterranean Diet  Why follow it? Research shows. Those who follow the Mediterranean diet have a reduced risk of heart disease  The diet is associated with a reduced incidence of Parkinson's and Alzheimer's diseases People following the diet may have longer life expectancies and lower rates of chronic diseases  The Dietary Guidelines for Americans recommends the Mediterranean diet as an eating plan to promote health and prevent disease  What Is the Mediterranean Diet?  Healthy eating plan based on typical foods and recipes of Mediterranean-style cooking The diet is primarily  a plant based diet; these foods should make up a majority of meals   Starches - Plant based foods should make up a majority of meals - They are an important sources of vitamins, minerals, energy, antioxidants, and fiber - Choose whole grains, foods high in fiber and minimally processed items  - Typical grain sources include wheat, oats, barley, corn, brown rice, bulgar, farro, millet, polenta, couscous  - Various types of beans include chickpeas, lentils, fava beans, black beans, white beans   Fruits  Veggies - Large quantities of antioxidant rich fruits & veggies; 6 or more servings  - Vegetables can be eaten raw or lightly drizzled with oil and cooked  - Vegetables common to the traditional Mediterranean Diet include: artichokes, arugula, beets, broccoli, brussel sprouts,  cabbage, carrots, celery, collard greens, cucumbers, eggplant, kale, leeks, lemons, lettuce, mushrooms, okra, onions, peas, peppers, potatoes, pumpkin, radishes, rutabaga, shallots, spinach, sweet potatoes, turnips, zucchini - Fruits common to the Mediterranean Diet include: apples, apricots, avocados, cherries, clementines, dates, figs, grapefruits, grapes, melons, nectarines, oranges, peaches, pears, pomegranates, strawberries, tangerines  Fats - Replace butter and margarine with healthy oils, such as olive oil, canola oil, and tahini  - Limit nuts to no more than a handful a day  - Nuts include walnuts, almonds, pecans, pistachios, pine nuts  - Limit or avoid candied, honey roasted or heavily salted nuts - Olives are central to the [employer] - can be eaten whole or used in a variety of dishes   Meats Protein - Limiting red meat: no more than a few times a month - When eating red meat: choose lean cuts and keep the portion to the size of deck of cards - Eggs: approx. 0 to 4 times a week  - Fish and lean poultry: at least 2 a week  - Healthy protein sources include, chicken, turkey, lean beef, lamb - Increase intake of seafood such as tuna, salmon, trout, mackerel, shrimp, scallops - Avoid or limit high fat processed meats such as sausage and bacon  Dairy - Include moderate amounts of low fat dairy products  - Focus on healthy dairy such as fat free yogurt, skim milk, low or reduced fat cheese - Limit dairy products higher in fat such as whole or 2% milk, cheese, ice cream  Alcohol - Moderate amounts of red wine is ok  - No more than 5 oz daily for women (all ages) and men older than age 67  - No more than 10 oz of wine daily for men younger than 77  Other - Limit sweets and other desserts  - Use herbs and spices instead of salt to flavor foods  - Herbs and spices common to the traditional Mediterranean Diet include: basil, bay leaves, chives, cloves, cumin, fennel, garlic, lavender,  marjoram, mint, oregano, parsley, pepper, rosemary, sage, savory, sumac, tarragon, thyme   It's not just a diet, it's a lifestyle:  The Mediterranean diet includes lifestyle factors typical of those in the region  Foods, drinks and meals are best eaten with others and savored Daily physical activity is important for overall good health This could be strenuous exercise like running and aerobics This could also be more leisurely activities such as walking, housework, yard-work, or taking the stairs Moderation is the key; a balanced and healthy diet accommodates most foods and drinks Consider portion sizes and frequency of consumption of certain foods   Meal Ideas & Options:  Breakfast:  Whole wheat toast or whole wheat English muffins with  peanut butter & hard boiled egg Steel cut oats topped with apples & cinnamon and skim milk  Fresh fruit: banana, strawberries, melon, berries, peaches  Smoothies: strawberries, bananas, greek yogurt, peanut butter Low fat greek yogurt with blueberries and granola  Egg white omelet with spinach and mushrooms Breakfast couscous: whole wheat couscous, apricots, skim milk, cranberries  Sandwiches:  Hummus and grilled vegetables (peppers, zucchini, squash) on whole wheat bread   Grilled chicken on whole wheat pita with lettuce, tomatoes, cucumbers or tzatziki  Tuna salad on whole wheat bread: tuna salad made with greek yogurt, olives, red peppers, capers, green onions Garlic rosemary lamb pita: lamb sauted with garlic, rosemary, salt & pepper; add lettuce, cucumber, greek yogurt to pita - flavor with lemon juice and black pepper  Seafood:  Mediterranean grilled salmon, seasoned with garlic, basil, parsley, lemon juice and black pepper Shrimp, lemon, and spinach whole-grain pasta salad made with low fat greek yogurt  Seared scallops with lemon orzo  Seared tuna steaks seasoned salt, pepper, coriander topped with tomato mixture of olives, tomatoes, olive oil,  minced garlic, parsley, green onions and cappers  Meats:  Herbed greek chicken salad with kalamata olives, cucumber, feta  Red bell peppers stuffed with spinach, bulgur, lean ground beef (or lentils) & topped with feta   Kebabs: skewers of chicken, tomatoes, onions, zucchini, squash  Turkey burgers: made with red onions, mint, dill, lemon juice, feta cheese topped with roasted red peppers Vegetarian Cucumber salad: cucumbers, artichoke hearts, celery, red onion, feta cheese, tossed in olive oil & lemon juice  Hummus and whole grain pita points with a greek salad (lettuce, tomato, feta, olives, cucumbers, red onion) Lentil soup with celery, carrots made with vegetable broth, garlic, salt and pepper  Tabouli salad: parsley, bulgur, mint, scallions, cucumbers, tomato, radishes, lemon juice, olive oil, salt and pepper.

## 2024-08-01 ENCOUNTER — Telehealth (HOSPITAL_COMMUNITY): Payer: Self-pay | Admitting: *Deleted

## 2024-08-01 ENCOUNTER — Encounter (HOSPITAL_COMMUNITY): Payer: Self-pay

## 2024-08-01 NOTE — Telephone Encounter (Signed)

## 2024-08-02 ENCOUNTER — Ambulatory Visit (HOSPITAL_BASED_OUTPATIENT_CLINIC_OR_DEPARTMENT_OTHER)
Admission: RE | Admit: 2024-08-02 | Discharge: 2024-08-02 | Disposition: A | Discharge status: Home / Self Care | Source: Ambulatory Visit | Attending: Nurse Practitioner | Admitting: Nurse Practitioner

## 2024-08-02 DIAGNOSIS — Z7189 Other specified counseling: Secondary | ICD-10-CM

## 2024-08-02 DIAGNOSIS — R931 Abnormal findings on diagnostic imaging of heart and coronary circulation: Secondary | ICD-10-CM | POA: Diagnosis not present

## 2024-08-02 MED ORDER — NITROGLYCERIN 0.4 MG SL SUBL
0.8000 mg | SUBLINGUAL_TABLET | Freq: Once | SUBLINGUAL | Status: AC
  Administered 2024-08-02: 0.8 mg via SUBLINGUAL

## 2024-08-02 MED ORDER — IOHEXOL 350 MG/ML SOLN
100.0000 mL | Freq: Once | INTRAVENOUS | Status: AC | PRN
  Administered 2024-08-02: 95 mL via INTRAVENOUS

## 2024-08-03 ENCOUNTER — Ambulatory Visit (HOSPITAL_BASED_OUTPATIENT_CLINIC_OR_DEPARTMENT_OTHER): Payer: Self-pay | Admitting: Nurse Practitioner

## 2024-10-24 ENCOUNTER — Ambulatory Visit (HOSPITAL_BASED_OUTPATIENT_CLINIC_OR_DEPARTMENT_OTHER): Admitting: Nurse Practitioner
